# Patient Record
Sex: Male | Born: 1980 | Race: White | Hispanic: No | Marital: Married | Smoking: Never smoker
Health system: Southern US, Community
[De-identification: ages and names within clinical notes are randomized; demographics above are authoritative.]

## PROBLEM LIST (undated history)

## (undated) DIAGNOSIS — R030 Elevated blood-pressure reading, without diagnosis of hypertension: Principal | ICD-10-CM

## (undated) DIAGNOSIS — J45909 Unspecified asthma, uncomplicated: Secondary | ICD-10-CM

## (undated) DIAGNOSIS — J302 Other seasonal allergic rhinitis: Secondary | ICD-10-CM

## (undated) DIAGNOSIS — IMO0001 Reserved for inherently not codable concepts without codable children: Secondary | ICD-10-CM

## (undated) DIAGNOSIS — G4733 Obstructive sleep apnea (adult) (pediatric): Secondary | ICD-10-CM

## (undated) HISTORY — DX: Obstructive sleep apnea (adult) (pediatric): G47.33

## (undated) HISTORY — DX: Unspecified asthma, uncomplicated: J45.909

## (undated) HISTORY — DX: Elevated blood-pressure reading, without diagnosis of hypertension: R03.0

## (undated) HISTORY — DX: Other seasonal allergic rhinitis: J30.2

## (undated) HISTORY — DX: Reserved for inherently not codable concepts without codable children: IMO0001

## (undated) HISTORY — PX: NASAL SEPTUM SURGERY: SHX37

---
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
## 2010-02-10 ENCOUNTER — Ambulatory Visit: Payer: Self-pay | Admitting: Otolaryngology

## 2011-07-26 HISTORY — PX: NASAL SEPTUM SURGERY: SHX37

## 2015-11-10 ENCOUNTER — Ambulatory Visit (INDEPENDENT_AMBULATORY_CARE_PROVIDER_SITE_OTHER): Payer: BC Managed Care – PPO | Admitting: Primary Care

## 2015-11-10 ENCOUNTER — Encounter: Payer: Self-pay | Admitting: Primary Care

## 2015-11-10 VITALS — BP 160/94 | HR 95 | Temp 97.7°F | Ht 68.0 in | Wt 206.4 lb

## 2015-11-10 DIAGNOSIS — F419 Anxiety disorder, unspecified: Secondary | ICD-10-CM | POA: Insufficient documentation

## 2015-11-10 DIAGNOSIS — I10 Essential (primary) hypertension: Secondary | ICD-10-CM | POA: Insufficient documentation

## 2015-11-10 DIAGNOSIS — J4520 Mild intermittent asthma, uncomplicated: Secondary | ICD-10-CM

## 2015-11-10 DIAGNOSIS — K219 Gastro-esophageal reflux disease without esophagitis: Secondary | ICD-10-CM | POA: Diagnosis not present

## 2015-11-10 DIAGNOSIS — K625 Hemorrhage of anus and rectum: Secondary | ICD-10-CM | POA: Diagnosis not present

## 2015-11-10 DIAGNOSIS — IMO0001 Reserved for inherently not codable concepts without codable children: Secondary | ICD-10-CM

## 2015-11-10 DIAGNOSIS — J45909 Unspecified asthma, uncomplicated: Secondary | ICD-10-CM | POA: Insufficient documentation

## 2015-11-10 DIAGNOSIS — R030 Elevated blood-pressure reading, without diagnosis of hypertension: Secondary | ICD-10-CM

## 2015-11-10 LAB — IFOBT (OCCULT BLOOD): IFOBT: NEGATIVE

## 2015-11-10 NOTE — Assessment & Plan Note (Signed)
Noticed on toilet paper after bowel movement 2 times in 2 weeks. Exam today with small, non thrombosed, non-tender, hemorrhoid. Discussed importance of water hydration, high fiber foods, etc.  Hemoccult stool card negative today. Will continue to monitor.

## 2015-11-10 NOTE — Progress Notes (Signed)
Subjective:    Patient ID: Mathew Jackson, male    DOB: 12-29-1980, 35 y.o.   MRN: [id number]  HPI  [name] is a 35 year old male who presents today to establish care and discuss the problems mentioned below. His last physical was 8-10 years ago and he's not recently sought medical care from a PCP.   1) Asthma: Diagnosed as a child. He follows with ENT (Dr. Elenore RotaJuengel) once annually. History of septal surgery. No problems with asthma symptoms   2) Rectal Bleeding: First noticed in his stool and on the toilet paper after wiping 2 weeks ago. This has occurred twice in the past 2 weeks and has not noticed since Saturday this past weekend. Denies constipation, he will have bowel movements 1-2 times daily. Denies unexplained weight loss, fatigue, weakness.   3) Elevated Blood Pressure: Denies prior history of elevated readings. He has a strong family history of heart disease and HTN with his father, uncle and grandfather, all on fathers side. Denies chest pain, headaches, dizziness, shortness of breath. He's not had a recent check of his blood pressure prior to today. He has noticed change in his vision recently.   He endorses a poor diet which consists of: Breakfast: Skips Lunch: Burgers, subs, cafeteria food Dinner: Fast food, rarely home cooked meals. Snacks: None Desserts: Occasionally Beverages: Soda, little water  Exercise: He does not currently exercise.   4) GERD: Esophageal burning and belching that has been present intermittently for the past 6 months. He will occasionally take Rolaids or Tums with improvement. He is aware of some trigger foods.  5) Anxiety: Over the past 10 years has experienced occasoinal anxiety and stress when thinking about his general health. This began after his father was diagnosed with Renal Cell Carcinoma and after his brother endured a traumatic brain injury. He will experience palpitations occasionally. He's able to manage his anxiety for now but just  wanted to disclose this information.   Review of Systems  Eyes: Negative for visual disturbance.  Respiratory: Negative for shortness of breath.   Cardiovascular: Negative for chest pain.  Gastrointestinal: Positive for blood in stool. Negative for abdominal pain.       Occasional GERD  Allergic/Immunologic: Positive for environmental allergies.  Neurological: Negative for dizziness and headaches.  Psychiatric/Behavioral:       See HPI       Past Medical History  Diagnosis Date  . Asthma   . Seasonal allergies   . OSA (obstructive sleep apnea)   . Elevated blood pressure      Social History   Social History  . Marital Status: Married    Spouse Name: N/A  . Number of Children: N/A  . Years of Education: N/A   Occupational History  . Not on file.   Social History Main Topics  . Smoking status: Never Smoker   . Smokeless tobacco: Not on file  . Alcohol Use: No  . Drug Use: No  . Sexual Activity: Not on file   Other Topics Concern  . Not on file   Social History Narrative   Married.   1 child.   Works at [employer].    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   Enjoys playing golf, traveling.     Past Surgical History  Procedure Laterality Date  . Nasal septum surgery      Family History  Problem Relation Age of Onset  . Heart failure Father   .  Hypertension Father   . Kidney cancer Father   . Heart attack Father   . Heart disease Paternal Uncle   . Heart disease Paternal Grandfather     No Known Allergies  No current outpatient prescriptions on file prior to visit.   No current facility-administered medications on file prior to visit.    BP 160/94 mmHg  Pulse 95  Temp(Src) 97.7 F (36.5 C) (Oral)  Ht  (1.727 m)  Wt 206 lb 6.4 oz (93.622 kg)  BMI 31.39 kg/m2  SpO2 98%    Objective:   Physical Exam  Constitutional: He is oriented to person, place, and time. He appears well-nourished.  Neck: Neck supple.   Cardiovascular: Normal rate and regular rhythm.   Pulmonary/Chest: Effort normal and breath sounds normal. He has no wheezes. He has no rales.  Genitourinary: Rectal exam shows external hemorrhoid. Guaiac negative stool.  Small, non thrombosed, non tender, external hemorrhoid to anus.   Neurological: He is alert and oriented to person, place, and time.  Skin: Skin is warm and dry.  Psychiatric: He has a normal mood and affect.          Assessment & Plan:

## 2015-11-10 NOTE — Assessment & Plan Note (Signed)
Belching and esophageal reflux intermittently x 6 months. Diet is poor and likely causing symptoms.  Discuss triggers, okay to take Tums PRN.

## 2015-11-10 NOTE — Progress Notes (Signed)
Pre visit review using our clinic review tool, if applicable. No additional management support is needed unless otherwise documented below in the visit note. 

## 2015-11-10 NOTE — Assessment & Plan Note (Signed)
Elevated today in office at 160/94. Strong FH of heart disease. Patient has no prior history of HTN. Will have him monitor BP if possible and follow up in 1 month for re-evaluation. Information provided regarding DASH diet.

## 2015-11-10 NOTE — Assessment & Plan Note (Signed)
Diagnosed as child, improved since adulthood. Follows with ENT for allergies and sinus problems.

## 2015-11-10 NOTE — Assessment & Plan Note (Signed)
Intermittent over the past 10 years. Mostly anxious about general health. Will continue to monitor and discuss in greater detail during physical.

## 2015-11-10 NOTE — Patient Instructions (Signed)
It is important that you improve your diet. Please limit fast food, fried food, sugary drinks, etc. Increase your consumption of fresh fruits and vegetables.  You need to consume about 64 ounces of water daily.  Start exercising. You should be getting 1 hour of moderate intensity exercise 5 days weekly.  Please notify me if you notice bleeding in your stool again.  Please schedule a physical with me within the next 3 months. You may also schedule a lab only appointment 3-4 days prior. We will discuss your lab results in detail during your physical.  Follow up in 1 month for re-evaluation of your blood pressure.   It was a pleasure to meet you today! Please don't hesitate to call me with any questions. Welcome to [facility]!    DASH Eating Plan DASH stands for "Dietary Approaches to Stop Hypertension." The DASH eating plan is a healthy eating plan that has been shown to reduce high blood pressure (hypertension). Additional health benefits may include reducing the risk of type 2 diabetes mellitus, heart disease, and stroke. The DASH eating plan may also help with weight loss. WHAT DO I NEED TO KNOW ABOUT THE DASH EATING PLAN? For the DASH eating plan, you will follow these general guidelines:  Choose foods with a percent daily value for sodium of less than 5% (as listed on the food label).  Use salt-free seasonings or herbs instead of table salt or sea salt.  Check with your health care provider or pharmacist before using salt substitutes.  Eat lower-sodium products, often labeled as "lower sodium" or "no salt added."  Eat fresh foods.  Eat more vegetables, fruits, and low-fat dairy products.  Choose whole grains. Look for the word "whole" as the first word in the ingredient list.  Choose fish and skinless chicken or [place] more often than red meat. Limit fish, poultry, and meat to 6 oz (170 g) each day.  Limit sweets, desserts, sugars, and sugary drinks.  Choose heart-healthy  fats.  Limit cheese to 1 oz (28 g) per day.  Eat more home-cooked food and less restaurant, buffet, and fast food.  Limit fried foods.  Cook foods using methods other than frying.  Limit canned vegetables. If you do use them, rinse them well to decrease the sodium.  When eating at a restaurant, ask that your food be prepared with less salt, or no salt if possible. WHAT FOODS CAN I EAT? Seek help from a dietitian for individual calorie needs. Grains Whole grain or whole wheat bread. Brown rice. Whole grain or whole wheat pasta. Quinoa, bulgur, and whole grain cereals. Low-sodium cereals. Corn or whole wheat flour tortillas. Whole grain cornbread. Whole grain crackers. Low-sodium crackers. Vegetables Fresh or frozen vegetables (raw, steamed, roasted, or grilled). Low-sodium or reduced-sodium tomato and vegetable juices. Low-sodium or reduced-sodium tomato sauce and paste. Low-sodium or reduced-sodium canned vegetables.  Fruits All fresh, canned (in natural juice), or frozen fruits. Meat and Other Protein Products Ground beef (85% or leaner), grass-fed beef, or beef trimmed of fat. Skinless chicken or [place]. Ground chicken or [place]. Pork trimmed of fat. All fish and seafood. Eggs. Dried beans, peas, or lentils. Unsalted nuts and seeds. Unsalted canned beans. Dairy Low-fat dairy products, such as skim or 1% milk, 2% or reduced-fat cheeses, low-fat ricotta or cottage cheese, or plain low-fat yogurt. Low-sodium or reduced-sodium cheeses. Fats and Oils Tub margarines without trans fats. Light or reduced-fat mayonnaise and salad dressings (reduced sodium). Avocado. Safflower, olive, or canola oils. Natural peanut  or almond butter. Other Unsalted popcorn and pretzels. The items listed above may not be a complete list of recommended foods or beverages. Contact your dietitian for more options. WHAT FOODS ARE NOT RECOMMENDED? Grains White bread. White pasta. White rice. Refined cornbread.  Bagels and croissants. Crackers that contain trans fat. Vegetables Creamed or fried vegetables. Vegetables in a cheese sauce. Regular canned vegetables. Regular canned tomato sauce and paste. Regular tomato and vegetable juices. Fruits Dried fruits. Canned fruit in light or heavy syrup. Fruit juice. Meat and Other Protein Products Fatty cuts of meat. Ribs, chicken wings, bacon, sausage, bologna, salami, chitterlings, fatback, hot dogs, bratwurst, and packaged luncheon meats. Salted nuts and seeds. Canned beans with salt. Dairy Whole or 2% milk, cream, half-and-half, and cream cheese. Whole-fat or sweetened yogurt. Full-fat cheeses or blue cheese. Nondairy creamers and whipped toppings. Processed cheese, cheese spreads, or cheese curds. Condiments Onion and garlic salt, seasoned salt, table salt, and sea salt. Canned and packaged gravies. Worcestershire sauce. Tartar sauce. Barbecue sauce. Teriyaki sauce. Soy sauce, including reduced sodium. Steak sauce. Fish sauce. Oyster sauce. Cocktail sauce. Horseradish. Ketchup and mustard. Meat flavorings and tenderizers. Bouillon cubes. Hot sauce. Tabasco sauce. Marinades. Taco seasonings. Relishes. Fats and Oils Butter, stick margarine, lard, shortening, ghee, and bacon fat. Coconut, palm kernel, or palm oils. Regular salad dressings. Other Pickles and olives. Salted popcorn and pretzels. The items listed above may not be a complete list of foods and beverages to avoid. Contact your dietitian for more information. WHERE CAN I FIND MORE INFORMATION? National Heart, Lung, and Blood Institute: CablePromo.itwww.nhlbi.nih.gov/health/health-topics/topics/dash/   This information is not intended to replace advice given to you by your health care provider. Make sure you discuss any questions you have with your health care provider.   Document Released: 06/30/2011 Document Revised: 08/01/2014 Document Reviewed: 05/15/2013 Elsevier Interactive Patient Education [employer]2016 Elsevier  Inc.

## 2015-11-11 NOTE — Addendum Note (Signed)
Addended by: Tawnya CrookSAMBATH, Edell Mesenbrink on: 11/11/2015 03:49 PM   Modules accepted: Orders

## 2015-12-14 ENCOUNTER — Ambulatory Visit: Payer: BC Managed Care – PPO | Admitting: Primary Care

## 2015-12-17 ENCOUNTER — Encounter: Payer: Self-pay | Admitting: Primary Care

## 2015-12-17 ENCOUNTER — Ambulatory Visit (INDEPENDENT_AMBULATORY_CARE_PROVIDER_SITE_OTHER): Payer: BC Managed Care – PPO | Admitting: Primary Care

## 2015-12-17 VITALS — BP 144/92 | HR 79 | Temp 97.8°F | Ht 68.0 in | Wt 208.1 lb

## 2015-12-17 DIAGNOSIS — Z23 Encounter for immunization: Secondary | ICD-10-CM

## 2015-12-17 DIAGNOSIS — R030 Elevated blood-pressure reading, without diagnosis of hypertension: Secondary | ICD-10-CM | POA: Diagnosis not present

## 2015-12-17 DIAGNOSIS — IMO0001 Reserved for inherently not codable concepts without codable children: Secondary | ICD-10-CM

## 2015-12-17 DIAGNOSIS — G4733 Obstructive sleep apnea (adult) (pediatric): Secondary | ICD-10-CM | POA: Diagnosis not present

## 2015-12-17 DIAGNOSIS — Z0000 Encounter for general adult medical examination without abnormal findings: Secondary | ICD-10-CM | POA: Insufficient documentation

## 2015-12-17 NOTE — Progress Notes (Signed)
Subjective:    Patient ID: Mathew Jackson, male    DOB: [birth year], 35 y.o.   MRN: [id number]  HPI  Mathew Jackson is a 35 year old male who presents today for complete physical.  Immunizations: -Tetanus: Unsure, believes it was in 2006.  -Influenza: Did not complete last season   Diet: Endorses a fair diet. Breakfast: Wheat toast Lunch: Subway sandwiches with chips, fast food Dinner: Reduced fast food, meat (chicken), vegetables, pastas, potatoes  Snacks: None Desserts: Occasionally, ice cream Beverages: Water (30-50 oz) daily, Pepsi (24-48 oz) daily  Exercise: He does not exercise. Eye exam: Completed numerous years ago. Dental exam: Completed numerous years ago.   1) Essential Hypertension: Elevated during his initial visit in mid April 2017 at 160/94. Blood pressure is elevated in the clinic today at 144/92. He is checking his blood pressure at home and is getting readings of 140's/80-90's. He's working to improve his water consumption and eat more vegetables. Denies chest pain, dizziness, shortness of breath. He does have sleep apnea and sleeps with a cpap machine. He's not had adjustment or equipment since 2011. He has noticed decrease in sleep and doesn't feel well rested. He's noticed his mask has been leaking.  Review of Systems  Constitutional: Negative for unexpected weight change.  HENT: Negative for rhinorrhea.   Respiratory: Negative for cough and shortness of breath.   Cardiovascular: Negative for chest pain.  Gastrointestinal: Negative for diarrhea and constipation.  Genitourinary: Negative for difficulty urinating.  Musculoskeletal: Negative for myalgias and arthralgias.  Skin: Negative for rash.  Allergic/Immunologic: Positive for environmental allergies.  Neurological: Negative for dizziness, numbness and headaches.  Psychiatric/Behavioral:       Denies concerns for anxiety and depression       Past Medical History  Diagnosis Date  . Asthma   .  Seasonal allergies   . OSA (obstructive sleep apnea)   . Elevated blood pressure      Social History   Social History  . Marital Status: Married    Spouse Name: N/A  . Number of Children: N/A  . Years of Education: N/A   Occupational History  . Not on file.   Social History Main Topics  . Smoking status: Never Smoker   . Smokeless tobacco: Not on file  . Alcohol Use: No  . Drug Use: No  . Sexual Activity: Not on file   Other Topics Concern  . Not on file   Social History Narrative   Married.   1 child.   Works at [employer].    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   Enjoys playing golf, traveling.     Past Surgical History  Procedure Laterality Date  . Nasal septum surgery      Family History  Problem Relation Age of Onset  . Heart failure Father   . Hypertension Father   . Kidney cancer Father   . Heart attack Father   . Heart disease Paternal Uncle   . Heart disease Paternal Grandfather     No Known Allergies  No current outpatient prescriptions on file prior to visit.   No current facility-administered medications on file prior to visit.    BP 144/92 mmHg  Pulse 79  Temp(Src) 97.8 F (36.6 C) (Oral)  Ht  (1.727 m)  Wt 208 lb 1.9 oz (94.403 kg)  BMI 31.65 kg/m2  SpO2 98%    Objective:   Physical Exam  Constitutional: He  is oriented to person, place, and time. He appears well-nourished.  HENT:  Right Ear: Tympanic membrane and ear canal normal.  Left Ear: Tympanic membrane and ear canal normal.  Nose: Nose normal. Right sinus exhibits no maxillary sinus tenderness and no frontal sinus tenderness. Left sinus exhibits no maxillary sinus tenderness and no frontal sinus tenderness.  Mouth/Throat: Oropharynx is clear and moist.  Eyes: Conjunctivae and EOM are normal. Pupils are equal, round, and reactive to light.  Neck: Neck supple. Carotid bruit is not present. No thyromegaly present.  Cardiovascular:  Normal rate, regular rhythm and normal heart sounds.   Pulmonary/Chest: Effort normal and breath sounds normal. He has no wheezes. He has no rales.  Abdominal: Soft. Bowel sounds are normal. There is no tenderness.  Musculoskeletal: Normal range of motion.  Neurological: He is alert and oriented to person, place, and time. He has normal reflexes. No cranial nerve deficit.  Skin: Skin is warm and dry.  Psychiatric: He has a normal mood and affect.          Assessment & Plan:

## 2015-12-17 NOTE — Assessment & Plan Note (Signed)
Td due, provided today. Diet has slightly improved, discussed the importance of a healthy diet and regular exercise in order for weight loss and to reduce risk of other medical diseases. Exam unremarkable. Labs pending.  Follow up in 3 months for BP check, 1 year for physical.

## 2015-12-17 NOTE — Addendum Note (Signed)
Addended by: Tawnya CrookSAMBATH, Loany Neuroth on: 12/17/2015 04:31 PM   Modules accepted: Orders, SmartSet

## 2015-12-17 NOTE — Assessment & Plan Note (Signed)
Improved from initial visit, but still above goal. Home readings are above goal as well. He did mention for the first time today that he has OSA and that his mask hasn't been fitting properly for months. Suspect this is likely contributing to his elevation in BP. Will send him to pulmonology for adjustment and then follow up for BP evaluation. Information for reduced sodium diet provided.

## 2015-12-17 NOTE — Patient Instructions (Signed)
Complete lab work prior to leaving today. I will notify you of your results once received.   You will be contacted regarding your referral to Pulmonology for adjustment in your CPAP machine settings and equipment.  Please let us know if you have not heard back within one week.   Continue to monitor your blood pressure once to twice weekly.   Follow up in 3 months for re-evaluation of your blood pressure.  It was a pleasure to see you today!  Low-Sodium Eating Plan Sodium raises blood pressure and causes water to be held in the body. Getting less sodium from food will help lower your blood pressure, reduce any swelling, and protect your heart, liver, and kidneys. We get sodium by adding salt (sodium chloride) to food. Most of our sodium comes from canned, boxed, and frozen foods. Restaurant foods, fast foods, and pizza are also very high in sodium. Even if you take medicine to lower your blood pressure or to reduce fluid in your body, getting less sodium from your food is important. WHAT IS MY PLAN? Most people should limit their sodium intake to 2,300 mg a day. Your health care provider recommends that you limit your sodium intake to __________ a day.  WHAT DO I NEED TO KNOW ABOUT THIS EATING PLAN? For the low-sodium eating plan, you will follow these general guidelines:  Choose foods with a % Daily Value for sodium of less than 5% (as listed on the food label).   Use salt-free seasonings or herbs instead of table salt or sea salt.   Check with your health care provider or pharmacist before using salt substitutes.   Eat fresh foods.  Eat more vegetables and fruits.  Limit canned vegetables. If you do use them, rinse them well to decrease the sodium.   Limit cheese to 1 oz (28 g) per day.   Eat lower-sodium products, often labeled as "lower sodium" or "no salt added."  Avoid foods that contain monosodium glutamate (MSG). MSG is sometimes added to [place] food and some canned  foods.  Check food labels (Nutrition Facts labels) on foods to learn how much sodium is in one serving.  Eat more home-cooked food and less restaurant, buffet, and fast food.  When eating at a restaurant, ask that your food be prepared with less salt, or no salt if possible.  HOW DO I READ FOOD LABELS FOR SODIUM INFORMATION? The Nutrition Facts label lists the amount of sodium in one serving of the food. If you eat more than one serving, you must multiply the listed amount of sodium by the number of servings. Food labels may also identify foods as:  Sodium free--Less than 5 mg in a serving.  Very low sodium--35 mg or less in a serving.  Low sodium--140 mg or less in a serving.  Light in sodium--50% less sodium in a serving. For example, if a food that usually has 300 mg of sodium is changed to become light in sodium, it will have 150 mg of sodium.  Reduced sodium--25% less sodium in a serving. For example, if a food that usually has 400 mg of sodium is changed to reduced sodium, it will have 300 mg of sodium. WHAT FOODS CAN I EAT? Grains Low-sodium cereals, including oats, puffed wheat and rice, and shredded wheat cereals. Low-sodium crackers. Unsalted rice and pasta. Lower-sodium bread.  Vegetables Frozen or fresh vegetables. Low-sodium or reduced-sodium canned vegetables. Low-sodium or reduced-sodium tomato sauce and paste. Low-sodium or reduced-sodium tomato and vegetable  juices.  Fruits Fresh, frozen, and canned fruit. Fruit juice.  Meat and Other Protein Products Low-sodium canned tuna and salmon. Fresh or frozen meat, poultry, seafood, and fish. Lamb. Unsalted nuts. Dried beans, peas, and lentils without added salt. Unsalted canned beans. Homemade soups without salt. Eggs.  Dairy Milk. Soy milk. Ricotta cheese. Low-sodium or reduced-sodium cheeses. Yogurt.  Condiments Fresh and dried herbs and spices. Salt-free seasonings. Onion and garlic powders. Low-sodium varieties  of mustard and ketchup. Fresh or refrigerated horseradish. Lemon juice.  Fats and Oils Reduced-sodium salad dressings. Unsalted butter.  Other Unsalted popcorn and pretzels.  The items listed above may not be a complete list of recommended foods or beverages. Contact your dietitian for more options. WHAT FOODS ARE NOT RECOMMENDED? Grains Instant hot cereals. Bread stuffing, pancake, and biscuit mixes. Croutons. Seasoned rice or pasta mixes. Noodle soup cups. Boxed or frozen macaroni and cheese. Self-rising flour. Regular salted crackers. Vegetables Regular canned vegetables. Regular canned tomato sauce and paste. Regular tomato and vegetable juices. Frozen vegetables in sauces. Salted [place] fries. Olives. Rosita Fire. Relishes. Sauerkraut. Salsa. Meat and Other Protein Products Salted, canned, smoked, spiced, or pickled meats, seafood, or fish. Bacon, ham, sausage, hot dogs, corned beef, chipped beef, and packaged luncheon meats. Salt pork. Jerky. Pickled herring. Anchovies, regular canned tuna, and sardines. Salted nuts. Dairy Processed cheese and cheese spreads. Cheese curds. Blue cheese and cottage cheese. Buttermilk.  Condiments Onion and garlic salt, seasoned salt, table salt, and sea salt. Canned and packaged gravies. Worcestershire sauce. Tartar sauce. Barbecue sauce. Teriyaki sauce. Soy sauce, including reduced sodium. Steak sauce. Fish sauce. Oyster sauce. Cocktail sauce. Horseradish that you find on the shelf. Regular ketchup and mustard. Meat flavorings and tenderizers. Bouillon cubes. Hot sauce. Tabasco sauce. Marinades. Taco seasonings. Relishes. Fats and Oils Regular salad dressings. Salted butter. Margarine. Ghee. Bacon fat.  Other Potato and tortilla chips. Corn chips and puffs. Salted popcorn and pretzels. Canned or dried soups. Pizza. Frozen entrees and pot pies.  The items listed above may not be a complete list of foods and beverages to avoid. Contact your dietitian  for more information.   This information is not intended to replace advice given to you by your health care provider. Make sure you discuss any questions you have with your health care provider.   Document Released: 12/31/2001 Document Revised: 08/01/2014 Document Reviewed: 05/15/2013 Elsevier Interactive Patient Education [employer]

## 2015-12-17 NOTE — Progress Notes (Signed)
Pre visit review using our clinic review tool, if applicable. No additional management support is needed unless otherwise documented below in the visit note. 

## 2015-12-17 NOTE — Assessment & Plan Note (Addendum)
Sleeps with Cpap and has done so since 2011. No recent adjustment in settings since 2011. Mask has been leaking for months. Referral placed to pulmonology for further evaluation and titration of settings. This is likely contributing to his elevation in BP.

## 2015-12-18 ENCOUNTER — Other Ambulatory Visit: Payer: Self-pay | Admitting: Primary Care

## 2015-12-18 DIAGNOSIS — E785 Hyperlipidemia, unspecified: Secondary | ICD-10-CM

## 2015-12-18 LAB — LIPID PANEL
Cholesterol: 221 mg/dL — ABNORMAL HIGH (ref 0–200)
HDL: 34.7 mg/dL — ABNORMAL LOW (ref >39.00)
NonHDL: 186.41
Total CHOL/HDL Ratio: 6
Triglycerides: 270 mg/dL — ABNORMAL HIGH (ref 0.0–149.0)
VLDL: 54 mg/dL — ABNORMAL HIGH (ref 0.0–40.0)

## 2015-12-18 LAB — COMPREHENSIVE METABOLIC PANEL
ALT: 65 U/L — ABNORMAL HIGH (ref 0–53)
AST: 36 U/L (ref 0–37)
Albumin: 5.1 g/dL (ref 3.5–5.2)
Alkaline Phosphatase: 60 U/L (ref 39–117)
BUN: 12 mg/dL (ref 6–23)
CO2: 29 mEq/L (ref 19–32)
Calcium: 9.9 mg/dL (ref 8.4–10.5)
Chloride: 102 mEq/L (ref 96–112)
Creatinine, Ser: 0.91 mg/dL (ref 0.40–1.50)
GFR: 101.12 mL/min (ref >60.00)
Glucose, Bld: 87 mg/dL (ref 70–99)
Potassium: 4 mEq/L (ref 3.5–5.1)
Sodium: 138 mEq/L (ref 135–145)
Total Bilirubin: 0.7 mg/dL (ref 0.2–1.2)
Total Protein: 8 g/dL (ref 6.0–8.3)

## 2015-12-18 LAB — TSH: TSH: 0.85 u[IU]/mL (ref 0.35–4.50)

## 2015-12-18 LAB — LDL CHOLESTEROL, DIRECT: Direct LDL: 140 mg/dL

## 2015-12-22 ENCOUNTER — Encounter: Payer: Self-pay | Admitting: *Deleted

## 2015-12-29 ENCOUNTER — Encounter (INDEPENDENT_AMBULATORY_CARE_PROVIDER_SITE_OTHER): Payer: Self-pay

## 2015-12-29 ENCOUNTER — Ambulatory Visit (INDEPENDENT_AMBULATORY_CARE_PROVIDER_SITE_OTHER): Payer: BC Managed Care – PPO | Admitting: Pulmonary Disease

## 2015-12-29 ENCOUNTER — Encounter: Payer: Self-pay | Admitting: Pulmonary Disease

## 2015-12-29 VITALS — BP 144/80 | HR 82 | Ht 68.0 in | Wt 209.0 lb

## 2015-12-29 DIAGNOSIS — G4733 Obstructive sleep apnea (adult) (pediatric): Secondary | ICD-10-CM | POA: Diagnosis not present

## 2015-12-29 DIAGNOSIS — E669 Obesity, unspecified: Secondary | ICD-10-CM

## 2015-12-29 NOTE — Progress Notes (Signed)
PULMONARY CONSULT NOTE  Requesting MD/Service: Vernona Rieger Date of initial consultation: 12/29/15 Reason for consultation: OSA  PT PROFILE: 35 y.o. M with OSA first diagnosed 2011  HPI:  43 M with hx of OSA who has been treated with CPAP since 2011. His current setting on the machine is 6 cm H2O. He reported to his primary care physician that his wife reports snoring despite the CPAP and he notes that his BP has been elevated. He also notes increasing daytime sleepiness. He e has gaind approx 25# in past couple of years after making a concerted effort at weight loss  Past Medical History  Diagnosis Date  . Asthma   . Seasonal allergies   . OSA (obstructive sleep apnea)   . Elevated blood pressure     Past Surgical History  Procedure Laterality Date  . Nasal septum surgery      MEDICATIONS: I have reviewed all medications and confirmed regimen as documented  Social History   Social History  . Marital Status: Married    Spouse Name: N/A  . Number of Children: N/A  . Years of Education: N/A   Occupational History  . Not on file.   Social History Main Topics  . Smoking status: Never Smoker   . Smokeless tobacco: Current User    Types: Snuff  . Alcohol Use: No  . Drug Use: No  . Sexual Activity: Not on file   Other Topics Concern  . Not on file   Social History Narrative   Married.   1 child.   Works at [employer].    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   Enjoys playing golf, traveling.     Family History  Problem Relation Age of Onset  . Heart failure Father   . Hypertension Father   . Kidney cancer Father   . Heart attack Father   . Lung cancer Father   . Heart disease Paternal Uncle   . Heart disease Paternal Grandfather     ROS: No fever, myalgias/arthralgias, unexplained weight loss or weight gain No new focal weakness or sensory deficits No otalgia, hearing loss, visual changes, nasal and sinus symptoms, mouth  and throat problems No neck pain or adenopathy No abdominal pain, N/V/D, diarrhea, change in bowel pattern No dysuria, change in urinary pattern No LE edema or calf tenderness   Filed Vitals:   12/29/15 1009  BP: 144/80  Pulse: 82  Height:  (1.727 m)  Weight: 209 lb (94.802 kg)  SpO2: 98%     EXAM:  Gen: WDWN, No overt respiratory distress HEENT: NCAT, TMs and canals normal, sclera white, nares and nasal mucosa normal, oropharynx normal Neck: Supple without LAN, thyromegaly, JVD Lungs: breath sounds full, percussion note normal throughout, No adventitious sounds Cardiovascular: Normal rate, reg rhythm, no murmurs noted Abdomen: Soft, nontender, normal BS Ext: without clubbing, cyanosis, edema Neuro: CNs grossly intact, motor and sensory intact, DTRs symmetric Skin: Limited exam, no lesions noted  DATA:   BMP Latest Ref Rng 12/17/2015  Glucose 70 - 99 mg/dL 87  BUN 6 - 23 mg/dL 12  Creatinine 8.29 - 5.62 mg/dL 1.30  Sodium 865 - 784 mEq/L 138  Potassium 3.5 - 5.1 mEq/L 4.0  Chloride 96 - 112 mEq/L 102  CO2 19 - 32 mEq/L 29  Calcium 8.4 - 10.5 mg/dL 9.9    No flowsheet data found.  CXR:  No film avaiable  IMPRESSION:  ICD-9-CM ICD-10-CM   1. OSA (obstructive sleep apnea) 327.23 G47.33 Split night study  2. Mild obesity 278.00 E66.9     PLAN:  Split night study ordered Discussed weight loss strategies - he knows what works for him as he has been successful in the past ROV 6 weeks   Billy Fischeravid Hollan Philipp, MD PCCM service Mobile [phone] Pager [phone] 12/29/2015

## 2016-02-09 ENCOUNTER — Ambulatory Visit: Payer: BC Managed Care – PPO | Attending: Pulmonary Disease

## 2016-02-09 DIAGNOSIS — G4733 Obstructive sleep apnea (adult) (pediatric): Secondary | ICD-10-CM | POA: Insufficient documentation

## 2016-02-10 ENCOUNTER — Ambulatory Visit: Payer: BC Managed Care – PPO | Admitting: Pulmonary Disease

## 2016-02-16 DIAGNOSIS — G4733 Obstructive sleep apnea (adult) (pediatric): Secondary | ICD-10-CM | POA: Diagnosis not present

## 2016-02-22 ENCOUNTER — Telehealth: Payer: Self-pay | Admitting: *Deleted

## 2016-02-22 NOTE — Telephone Encounter (Signed)
LMOM for pt to call back for results

## 2016-02-24 NOTE — Telephone Encounter (Signed)
LMOVM for pt to return call for results. 

## 2016-02-25 NOTE — Telephone Encounter (Signed)
LM for pt to return call for results

## 2016-02-26 ENCOUNTER — Encounter: Payer: Self-pay | Admitting: *Deleted

## 2016-02-26 NOTE — Telephone Encounter (Signed)
LMOM for pt to return call. Will send letter and close encounter due to multiple attempts to contact pt.

## 2016-03-02 ENCOUNTER — Ambulatory Visit: Payer: BC Managed Care – PPO | Admitting: Pulmonary Disease

## 2016-03-02 ENCOUNTER — Telehealth: Payer: Self-pay | Admitting: *Deleted

## 2016-03-02 DIAGNOSIS — G4733 Obstructive sleep apnea (adult) (pediatric): Secondary | ICD-10-CM

## 2016-03-02 NOTE — Telephone Encounter (Signed)
Gave pt results of sleep study and cancelled today's appt per DS since pt has just called back after multiple attempts of trying to contact pt. New appt scheduled. Order placed for CPAP. Nothing further needed.

## 2016-03-15 ENCOUNTER — Ambulatory Visit: Payer: BC Managed Care – PPO | Admitting: Primary Care

## 2016-03-15 ENCOUNTER — Telehealth: Payer: Self-pay | Admitting: Primary Care

## 2016-03-15 DIAGNOSIS — Z0289 Encounter for other administrative examinations: Secondary | ICD-10-CM

## 2016-03-15 NOTE — Telephone Encounter (Signed)
Patient did not come in for their appointment today for re-eval of bp  Please let me know if patient needs to be contacted immediately for follow up or no follow up needed.

## 2016-03-15 NOTE — Telephone Encounter (Signed)
Left message for pt to return call.

## 2016-03-15 NOTE — Telephone Encounter (Signed)
Please reschedule patient at his convenience.

## 2016-03-18 NOTE — Telephone Encounter (Signed)
Left message for patient to return call - need to rs appt

## 2016-03-23 ENCOUNTER — Encounter: Payer: Self-pay | Admitting: Primary Care

## 2016-03-23 NOTE — Telephone Encounter (Signed)
Letter sent to pt to rs appt

## 2016-04-10 ENCOUNTER — Encounter: Payer: Self-pay | Admitting: Pulmonary Disease

## 2016-04-14 ENCOUNTER — Encounter: Payer: Self-pay | Admitting: *Deleted

## 2016-04-14 ENCOUNTER — Ambulatory Visit: Payer: BC Managed Care – PPO | Admitting: Pulmonary Disease

## 2016-05-09 ENCOUNTER — Encounter: Payer: Self-pay | Admitting: Pulmonary Disease

## 2016-08-31 ENCOUNTER — Ambulatory Visit: Payer: BC Managed Care – PPO | Admitting: Primary Care

## 2016-09-01 ENCOUNTER — Ambulatory Visit: Payer: Self-pay | Admitting: Primary Care

## 2018-03-20 ENCOUNTER — Other Ambulatory Visit: Payer: Self-pay

## 2018-03-20 ENCOUNTER — Emergency Department (HOSPITAL_COMMUNITY): Payer: BC Managed Care – PPO

## 2018-03-20 ENCOUNTER — Encounter (HOSPITAL_COMMUNITY): Payer: Self-pay | Admitting: Emergency Medicine

## 2018-03-20 ENCOUNTER — Emergency Department (HOSPITAL_COMMUNITY)
Admission: EM | Admit: 2018-03-20 | Discharge: 2018-03-21 | Disposition: A | Discharge status: Home / Self Care | Payer: BC Managed Care – PPO | Attending: Emergency Medicine | Admitting: Emergency Medicine

## 2018-03-20 DIAGNOSIS — J45909 Unspecified asthma, uncomplicated: Secondary | ICD-10-CM | POA: Diagnosis not present

## 2018-03-20 DIAGNOSIS — F17220 Nicotine dependence, chewing tobacco, uncomplicated: Secondary | ICD-10-CM | POA: Insufficient documentation

## 2018-03-20 DIAGNOSIS — R109 Unspecified abdominal pain: Secondary | ICD-10-CM

## 2018-03-20 DIAGNOSIS — R1031 Right lower quadrant pain: Secondary | ICD-10-CM | POA: Insufficient documentation

## 2018-03-20 LAB — COMPREHENSIVE METABOLIC PANEL
ALBUMIN: 4.1 g/dL (ref 3.5–5.0)
ALT: 82 U/L — ABNORMAL HIGH (ref 0–44)
AST: 46 U/L — AB (ref 15–41)
Alkaline Phosphatase: 57 U/L (ref 38–126)
Anion gap: 10 (ref 5–15)
BILIRUBIN TOTAL: 0.7 mg/dL (ref 0.3–1.2)
BUN: 13 mg/dL (ref 6–20)
CHLORIDE: 102 mmol/L (ref 98–111)
CO2: 25 mmol/L (ref 22–32)
Calcium: 9.1 mg/dL (ref 8.9–10.3)
Creatinine, Ser: 0.97 mg/dL (ref 0.61–1.24)
GFR calc Af Amer: >60 mL/min (ref >60)
GFR calc non Af Amer: >60 mL/min (ref >60)
GLUCOSE: 111 mg/dL — AB (ref 70–99)
POTASSIUM: 4.3 mmol/L (ref 3.5–5.1)
Sodium: 137 mmol/L (ref 135–145)
Total Protein: 7.7 g/dL (ref 6.5–8.1)

## 2018-03-20 LAB — CBC WITH DIFFERENTIAL/PLATELET
Abs Immature Granulocytes: 0 10*3/uL (ref 0.0–0.1)
Basophils Absolute: 0 10*3/uL (ref 0.0–0.1)
Basophils Relative: 0 %
Eosinophils Absolute: 0.2 10*3/uL (ref 0.0–0.7)
Eosinophils Relative: 2 %
HEMATOCRIT: 40.5 % (ref 39.0–52.0)
HEMOGLOBIN: 13.8 g/dL (ref 13.0–17.0)
Immature Granulocytes: 0 %
LYMPHS ABS: 1.4 10*3/uL (ref 0.7–4.0)
LYMPHS PCT: 20 %
MCH: 29.9 pg (ref 26.0–34.0)
MCHC: 34.1 g/dL (ref 30.0–36.0)
MCV: 87.7 fL (ref 78.0–100.0)
MONO ABS: 0.5 10*3/uL (ref 0.1–1.0)
Monocytes Relative: 7 %
Neutro Abs: 4.9 10*3/uL (ref 1.7–7.7)
Neutrophils Relative %: 71 %
Platelets: 248 10*3/uL (ref 150–400)
RBC: 4.62 MIL/uL (ref 4.22–5.81)
RDW: 12.1 % (ref 11.5–15.5)
WBC: 7 10*3/uL (ref 4.0–10.5)

## 2018-03-20 LAB — LIPASE, BLOOD: Lipase: 37 U/L (ref 11–51)

## 2018-03-20 LAB — I-STAT CG4 LACTIC ACID, ED: LACTIC ACID, VENOUS: 1.22 mmol/L (ref 0.5–1.9)

## 2018-03-20 MED ORDER — ACETAMINOPHEN 325 MG PO TABS
650.0000 mg | ORAL_TABLET | Freq: Once | ORAL | Status: AC | PRN
  Administered 2018-03-20: 650 mg via ORAL
  Filled 2018-03-20: qty 2

## 2018-03-20 NOTE — ED Triage Notes (Signed)
Pt reports sob d/t lower RLQ abd pain with fever that started this past weekend. Pt states increase in pain upon inspiration. Pt reports some diarrhea last week. Pt denies N/V, cough.

## 2018-03-21 ENCOUNTER — Emergency Department (HOSPITAL_COMMUNITY): Payer: BC Managed Care – PPO

## 2018-03-21 LAB — URINALYSIS, ROUTINE W REFLEX MICROSCOPIC
Bilirubin Urine: NEGATIVE
GLUCOSE, UA: NEGATIVE mg/dL
Hgb urine dipstick: NEGATIVE
KETONES UR: NEGATIVE mg/dL
LEUKOCYTES UA: NEGATIVE
NITRITE: NEGATIVE
PH: 5 (ref 5.0–8.0)
PROTEIN: NEGATIVE mg/dL
Specific Gravity, Urine: >1.046 — ABNORMAL HIGH (ref 1.005–1.030)

## 2018-03-21 LAB — I-STAT CG4 LACTIC ACID, ED: Lactic Acid, Venous: 0.61 mmol/L (ref 0.5–1.9)

## 2018-03-21 MED ORDER — IOPAMIDOL (ISOVUE-300) INJECTION 61%
INTRAVENOUS | Status: AC
  Filled 2018-03-21: qty 100

## 2018-03-21 MED ORDER — SODIUM CHLORIDE 0.9 % IV BOLUS
1000.0000 mL | Freq: Once | INTRAVENOUS | Status: AC
  Administered 2018-03-21: 1000 mL via INTRAVENOUS

## 2018-03-21 MED ORDER — IOPAMIDOL (ISOVUE-300) INJECTION 61%
100.0000 mL | Freq: Once | INTRAVENOUS | Status: AC | PRN
  Administered 2018-03-21: 100 mL via INTRAVENOUS

## 2018-03-21 NOTE — Discharge Instructions (Addendum)
Ibuprofen 600 mg every 6 hours as needed for pain.  Return to the emergency department for worsening pain, high fevers, bloody stools, or other new and concerning symptoms.

## 2018-03-21 NOTE — ED Notes (Signed)
ED Provider at bedside. 

## 2018-03-21 NOTE — ED Notes (Signed)
Patient transported to CT 

## 2018-03-21 NOTE — ED Provider Notes (Signed)
[facility]   CSN: [id number] Arrival date & time: 03/20/18  2103     History   Chief Complaint Chief Complaint  Patient presents with  . Shortness of Breath  . Fever  . Abdominal Pain    HPI Mathew Jackson is a 37 y.o. male.  Patient is a 37 year old male with significant past medical history.  He presents today for evaluation of abdominal pain.  He reports having what he thought was a stomach virus last week.  This caused him to have diarrhea and abdominal cramping.  This seemed to resolve, then his appetite returned.  Over the weekend, he developed increasing right lower quadrant abdominal pain with associated fever.  He denies any diarrhea or vomiting.  He denies any bloody stools.  He denies any prior abdominal surgery.  He states that the pain in his abdomen is worse when he breathes or moves.  The history is provided by the patient.  Abdominal Pain   This is a new problem. Episode onset: 3 days ago. The problem occurs constantly. The problem has been gradually worsening. The pain is associated with an unknown factor. The pain is located in the RLQ. The quality of the pain is cramping. The pain is moderate. Pertinent negatives include fever, melena and constipation. Nothing aggravates the symptoms. Nothing relieves the symptoms.    Past Medical History:  Diagnosis Date  . Asthma   . Elevated blood pressure   . OSA (obstructive sleep apnea)   . Seasonal allergies     Patient Active Problem List   Diagnosis Date Noted  . OSA (obstructive sleep apnea) 12/17/2015  . Preventative health care 12/17/2015  . Elevated blood pressure 11/10/2015  . Rectal bleeding 11/10/2015  . Asthma 11/10/2015  . GERD (gastroesophageal reflux disease) 11/10/2015  . Anxiety 11/10/2015    Past Surgical History:  Procedure Laterality Date  . NASAL SEPTUM SURGERY          Home Medications    Prior to Admission medications     Medication Sig Start Date End Date Taking? Authorizing Provider  acetaminophen (TYLENOL) 500 MG tablet Take 500 mg by mouth as needed.    [provider]    Family History Family History  Problem Relation Age of Onset  . Heart failure Father   . Hypertension Father   . Kidney cancer Father   . Heart attack Father   . Lung cancer Father   . Heart disease Paternal Uncle   . Heart disease Paternal Grandfather     Social History Social History   Tobacco Use  . Smoking status: Never Smoker  . Smokeless tobacco: Current User    Types: Snuff  Substance Use Topics  . Alcohol use: No    Alcohol/week: 0.0 standard drinks  . Drug use: No     Allergies   Patient has no known allergies.   Review of Systems Review of Systems  Constitutional: Negative for fever.  Gastrointestinal: Positive for abdominal pain. Negative for constipation and melena.  All other systems reviewed and are negative.    Physical Exam Updated Vital Signs BP 117/80 (BP Location: Right Arm)   Pulse 77   Temp (!) 100.9 F (38.3 C) (Oral)   Resp 14   Ht 5\' 8"  (1.727 m)   Wt 96.2 kg   SpO2 99%   BMI 32.23 kg/m   Physical Exam  Constitutional: He is oriented to person, place, and time. He appears well-developed  and well-nourished. No distress.  HENT:  Head: Normocephalic and atraumatic.  Mouth/Throat: Oropharynx is clear and moist.  Neck: Normal range of motion. Neck supple.  Cardiovascular: Normal rate and regular rhythm. Exam reveals no friction rub.  No murmur heard. Pulmonary/Chest: Effort normal and breath sounds normal. No respiratory distress. He has no wheezes. He has no rales.  Abdominal: Soft. Bowel sounds are normal. He exhibits no distension. There is tenderness. There is no rebound and no guarding.  There is ttp in the right lower quadrant.  Musculoskeletal: Normal range of motion. He exhibits no edema.  Neurological: He is alert and oriented to person, place, and time.  Coordination normal.  Skin: Skin is warm and dry. He is not diaphoretic.  Nursing note and vitals reviewed.    ED Treatments / Results  Labs (all labs ordered are listed, but only abnormal results are displayed) Labs Reviewed  COMPREHENSIVE METABOLIC PANEL - Abnormal; Notable for the following components:      Result Value   Glucose, Bld 111 (*)    AST 46 (*)    ALT 82 (*)    All other components within normal limits  CBC WITH DIFFERENTIAL/PLATELET  LIPASE, BLOOD  URINALYSIS, ROUTINE W REFLEX MICROSCOPIC  I-STAT CG4 LACTIC ACID, ED  I-STAT CG4 LACTIC ACID, ED    EKG None  Radiology Dg Chest 2 View  Result Date: 03/20/2018 CLINICAL DATA:  37 year old male with shortness of breath. EXAM: CHEST - 2 VIEW COMPARISON:  None. FINDINGS: The heart size and mediastinal contours are within normal limits. Both lungs are clear. The visualized skeletal structures are unremarkable. IMPRESSION: No active cardiopulmonary disease. Electronically Signed   By: Elgie Collard M.D.   On: 03/20/2018 22:15    Procedures Procedures (including critical care time)  Medications Ordered in ED Medications  sodium chloride 0.9 % bolus 1,000 mL (has no administration in time range)  acetaminophen (TYLENOL) tablet 650 mg (650 mg Oral Given 03/20/18 2118)     Initial Impression / Assessment and Plan / ED Course  I have reviewed the triage vital signs and the nursing notes.  Pertinent labs & imaging results that were available during my care of the patient were reviewed by me and considered in my medical decision making (see chart for details).  Patient presenting with complaints of pain in his right side/right lower quadrant for the past several days.  He was found to be febrile with a temperature of 100.9, however has no elevation of his white count.  He does have some tenderness to the lateral aspect of the abdomen.  A CT scan was obtained which shows no sign of acute abnormality.  I am uncertain as  to the exact etiology of his discomfort, however nothing appears surgical or emergent.  I have considered pulmonary embolism, however there is no hypoxia, tachycardia, or tachypnea.  At this point, I feel as though he is appropriate for discharge with outpatient follow-up and return as needed.  Final Clinical Impressions(s) / ED Diagnoses   Final diagnoses:  None    ED Discharge Orders    None       Geoffery Lyons, MD 03/21/18 [phone]

## 2018-08-27 ENCOUNTER — Encounter: Payer: Self-pay | Admitting: Primary Care

## 2018-08-27 ENCOUNTER — Ambulatory Visit: Payer: BC Managed Care – PPO | Admitting: Primary Care

## 2018-08-27 VITALS — BP 140/94 | HR 95 | Temp 98.1°F | Ht 68.0 in | Wt 213.5 lb

## 2018-08-27 DIAGNOSIS — F4323 Adjustment disorder with mixed anxiety and depressed mood: Secondary | ICD-10-CM | POA: Diagnosis not present

## 2018-08-27 DIAGNOSIS — I10 Essential (primary) hypertension: Secondary | ICD-10-CM

## 2018-08-27 DIAGNOSIS — Z23 Encounter for immunization: Secondary | ICD-10-CM | POA: Diagnosis not present

## 2018-08-27 MED ORDER — ESCITALOPRAM OXALATE 10 MG PO TABS: 10.0000 mg | ORAL_TABLET | Freq: Every day | ORAL | 1 refills | Status: DC

## 2018-08-27 NOTE — Assessment & Plan Note (Signed)
GAD 7 score of 19 and PHQ 9 score of 19 today. Denies SI/HI. Discussed options for treatment, he agrees to both therapy and medication which I believe to be appropriate.   Rx for Lexapro 10 mg sent to pharmacy and referral placed to therapy. Patient is to take 1/2 tablet daily for 6 days, then advance to 1 full tablet thereafter. We discussed possible side effects of headache, GI upset, drowsiness, and SI/HI. If thoughts of SI/HI develop, we discussed to present to the emergency immediately. Patient verbalized understanding.   Follow up in 6 weeks for re-evaluation.

## 2018-08-27 NOTE — Progress Notes (Signed)
Subjective:    Patient ID: Mathew Jackson, male    DOB: March 08, 1981, 38 y.o.   MRN: [id number]  HPI  Mathew Jackson is a 38 year old male with a history of anxiety who presents today with a chief complaint of anxiety.  Symptoms of feeling anxious, emotional with difficulty controlling, daily worry, irritability, grieving the loss of his father and uncle over the last two years, martial problems. Symptoms have chronic for years, worse over the last 6 months.   He has never been treated with medication in the past. GAD 7 score of 19 and PH9 score of 21 today. He denies SI/HI.   Review of Systems  Respiratory: Negative for shortness of breath.   Cardiovascular: Negative for chest pain and palpitations.  Neurological: Negative for dizziness and headaches.  Psychiatric/Behavioral:       See HPI       Past Medical History:  Diagnosis Date  . Asthma   . Elevated blood pressure   . OSA (obstructive sleep apnea)   . Seasonal allergies      Social History   Socioeconomic History  . Marital status: Married    Spouse name: Not on file  . Number of children: Not on file  . Years of education: Not on file  . Highest education level: Not on file  Occupational History  . Not on file  Social Needs  . Financial resource strain: Not on file  . Food insecurity:    Worry: Not on file    Inability: Not on file  . Transportation needs:    Medical: Not on file    Non-medical: Not on file  Tobacco Use  . Smoking status: Never Smoker  . Smokeless tobacco: Current User    Types: Snuff  Substance and Sexual Activity  . Alcohol use: No    Alcohol/week: 0.0 standard drinks  . Drug use: No  . Sexual activity: Not on file  Lifestyle  . Physical activity:    Days per week: Not on file    Minutes per session: Not on file  . Stress: Not on file  Relationships  . Social connections:    Talks on phone: Not on file    Gets together: Not on file    Attends religious service: Not on file   Active member of club or organization: Not on file    Attends meetings of clubs or organizations: Not on file    Relationship status: Not on file  . Intimate partner violence:    Fear of current or ex partner: Not on file    Emotionally abused: Not on file    Physically abused: Not on file    Forced sexual activity: Not on file  Other Topics Concern  . Not on file  Social History Narrative   Married.   1 child.   Works at [employer].    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   Enjoys playing golf, traveling.     Past Surgical History:  Procedure Laterality Date  . NASAL SEPTUM SURGERY      Family History  Problem Relation Age of Onset  . Heart failure Father   . Hypertension Father   . Kidney cancer Father   . Heart attack Father   . Lung cancer Father   . Heart disease Paternal Uncle   . Heart disease Paternal Grandfather     No Known Allergies  No current outpatient medications on  file prior to visit.   No current facility-administered medications on file prior to visit.     BP (!) 140/94   Pulse 95   Temp 98.1 F (36.7 C) (Oral)   Ht 5\' 8"  (1.727 m)   Wt 213 lb 8 oz (96.8 kg)   SpO2 98%   BMI 32.46 kg/m    Objective:   Physical Exam  Constitutional: He appears well-nourished.  Neck: Neck supple.  Cardiovascular: Normal rate and regular rhythm.  Respiratory: Effort normal and breath sounds normal.  Skin: Skin is warm and dry.  Psychiatric: He has a normal mood and affect.           Assessment & Plan:

## 2018-08-27 NOTE — Assessment & Plan Note (Signed)
Above goal in the office today, also with prior readings. Do recommend we address this, will have him start monitoring home readings. Given that we are initiating SSRI today, we will initiate hypertensive treatment during an upcoming follow up visit.

## 2018-08-27 NOTE — Addendum Note (Signed)
Addended by: Tawnya Crook on: 08/27/2018 03:34 PM   Modules accepted: Orders

## 2018-08-27 NOTE — Patient Instructions (Signed)
Start escitalopram 10 mg tablets (Lexapro) for anxiety and depression. Start by taking 1/2 tablet daily for 6 days, then increase to 1 full tablet thereafter.  You will be contacted regarding your referral to therapy.  Please let us know if you have not been contacted within one week.   Start monitoring your blood pressure daily, around the same time of day, for the next several weeks.  Ensure that you have rested for 30 minutes prior to checking your blood pressure. Record your readings and bring them to your next visit.  Schedule a follow up visit for 6 weeks for re-evaluation of anxiety and depression.  It was a pleasure to see you today!

## 2018-09-20 ENCOUNTER — Ambulatory Visit: Payer: BC Managed Care – PPO | Admitting: Psychology

## 2018-09-20 DIAGNOSIS — F4323 Adjustment disorder with mixed anxiety and depressed mood: Secondary | ICD-10-CM | POA: Diagnosis not present

## 2018-10-08 ENCOUNTER — Other Ambulatory Visit: Payer: Self-pay

## 2018-10-08 ENCOUNTER — Encounter: Payer: Self-pay | Admitting: Primary Care

## 2018-10-08 ENCOUNTER — Ambulatory Visit: Payer: BC Managed Care – PPO | Admitting: Primary Care

## 2018-10-08 VITALS — BP 140/90 | HR 94 | Temp 98.3°F | Ht 68.0 in | Wt 207.5 lb

## 2018-10-08 DIAGNOSIS — I10 Essential (primary) hypertension: Secondary | ICD-10-CM

## 2018-10-08 DIAGNOSIS — F4323 Adjustment disorder with mixed anxiety and depressed mood: Secondary | ICD-10-CM | POA: Diagnosis not present

## 2018-10-08 DIAGNOSIS — J302 Other seasonal allergic rhinitis: Secondary | ICD-10-CM

## 2018-10-08 MED ORDER — ESCITALOPRAM OXALATE 10 MG PO TABS: 10.0000 mg | ORAL_TABLET | Freq: Every day | ORAL | 1 refills | Status: DC

## 2018-10-08 NOTE — Assessment & Plan Note (Signed)
Improved on Lexapro 10 mg, denies SI/HI. Seems to be doing well with the combination of Lexapro and therapy. Continue current regimen.

## 2018-10-08 NOTE — Assessment & Plan Note (Signed)
Intermittent. Using Allegra-D daily. Discussed to stop Allegra-D, start plain Allegra, Flonase if needed.  Discussed that Allegra-D can contribute to hypertension and cause rebound symptoms. He verbalized understanding.

## 2018-10-08 NOTE — Assessment & Plan Note (Signed)
Above goal today, also taking Allegra-D daily.  Will have him continue to work on diet and exercise, follow up in 3 months for BP check.

## 2018-10-08 NOTE — Patient Instructions (Signed)
Continue escitalopram (Lexapro) 10 mg daily.   Continue to monitor your blood pressure as discussed.   Stop taking Allegra-D, try plain Allegra, Zyrtec, or Xyzal.  Please schedule a follow up appointment in 3 months for blood pressure check.  It was a pleasure to see you today!

## 2018-10-08 NOTE — Progress Notes (Signed)
Subjective:    Patient ID: Mathew Jackson, male    DOB: 06/01/1981, 38 y.o.   MRN: [id number]  HPI  Mathew Jackson is a 38 year old male who presents today for follow up of anxiety and depression, and elevated blood pressure.  1) Anxiety and Depression: He was last evaluated on 08/27/18 for a two year history of feeling anxious, difficulty controlling worry with daily worry, irritability, etc. GAD 7 score of 19 and PHQ 9 score of 21 so he was treated with Lexapro 10 mg and referred to therapy.  Since his last visit he's feeling better. Positive effects include better concentration, more excitement about everyday life, decrease in worry, has more of a balance emotionally, feeling more happy. He has been to therapy once and feels that it went well.   He denies headaches, SI/HI, dizziness, SI/HI.   2) Elevated Blood Pressure: Noted to be elevated during his visit in early February 2020, also today. Since his last visit he's checked his BP a few times and is getting readings of 130's/80's. He's been working towards weight loss through healthier eating.   BP Readings from Last 3 Encounters:  10/08/18 140/90  08/27/18 (!) 140/94  03/21/18 105/70   Wt Readings from Last 3 Encounters:  10/08/18 207 lb 8 oz (94.1 kg)  08/27/18 213 lb 8 oz (96.8 kg)  03/20/18 212 lb (96.2 kg)     Review of Systems  Cardiovascular: Negative for chest pain.  Gastrointestinal: Negative for nausea.  Allergic/Immunologic: Positive for environmental allergies.  Neurological: Negative for headaches.  Psychiatric/Behavioral:       See HPI       Past Medical History:  Diagnosis Date  . Asthma   . Elevated blood pressure   . OSA (obstructive sleep apnea)   . Seasonal allergies      Social History   Socioeconomic History  . Marital status: Married    Spouse name: Not on file  . Number of children: Not on file  . Years of education: Not on file  . Highest education level: Not on file  Occupational  History  . Not on file  Social Needs  . Financial resource strain: Not on file  . Food insecurity:    Worry: Not on file    Inability: Not on file  . Transportation needs:    Medical: Not on file    Non-medical: Not on file  Tobacco Use  . Smoking status: Never Smoker  . Smokeless tobacco: Current User    Types: Snuff  Substance and Sexual Activity  . Alcohol use: No    Alcohol/week: 0.0 standard drinks  . Drug use: No  . Sexual activity: Not on file  Lifestyle  . Physical activity:    Days per week: Not on file    Minutes per session: Not on file  . Stress: Not on file  Relationships  . Social connections:    Talks on phone: Not on file    Gets together: Not on file    Attends religious service: Not on file    Active member of club or organization: Not on file    Attends meetings of clubs or organizations: Not on file    Relationship status: Not on file  . Intimate partner violence:    Fear of current or ex partner: Not on file    Emotionally abused: Not on file    Physically abused: Not on file    Forced sexual activity: Not on  file  Other Topics Concern  . Not on file  Social History Narrative   Married.   1 child.   Works at [employer].    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   Enjoys playing golf, traveling.     Past Surgical History:  Procedure Laterality Date  . NASAL SEPTUM SURGERY      Family History  Problem Relation Age of Onset  . Heart failure Father   . Hypertension Father   . Kidney cancer Father   . Heart attack Father   . Lung cancer Father   . Heart disease Paternal Uncle   . Heart disease Paternal Grandfather     No Known Allergies  No current outpatient medications on file prior to visit.   No current facility-administered medications on file prior to visit.     BP 140/90   Pulse 94   Temp 98.3 F (36.8 C) (Oral)   Ht 5\' 8"  (1.727 m)   Wt 207 lb 8 oz (94.1 kg)   SpO2 97%   BMI 31.55 kg/m      Objective:   Physical Exam  Constitutional: He appears well-nourished.  Neck: Neck supple.  Cardiovascular: Normal rate and regular rhythm.  Respiratory: Effort normal and breath sounds normal.  Skin: Skin is warm and dry.  Psychiatric: He has a normal mood and affect.           Assessment & Plan:

## 2018-10-11 ENCOUNTER — Other Ambulatory Visit: Payer: Self-pay

## 2018-10-11 ENCOUNTER — Ambulatory Visit: Payer: BC Managed Care – PPO | Admitting: Psychology

## 2018-10-11 DIAGNOSIS — F4323 Adjustment disorder with mixed anxiety and depressed mood: Secondary | ICD-10-CM | POA: Diagnosis not present

## 2019-01-10 ENCOUNTER — Ambulatory Visit: Payer: Self-pay | Admitting: Primary Care

## 2019-03-16 IMAGING — DX DG CHEST 2V
2 series · 2 of 2 positions shown · non-contrast
Comparison: None.

CLINICAL DATA: 36-year-old male with shortness of breath.

EXAM:
CHEST - 2 VIEW

[chest pa]
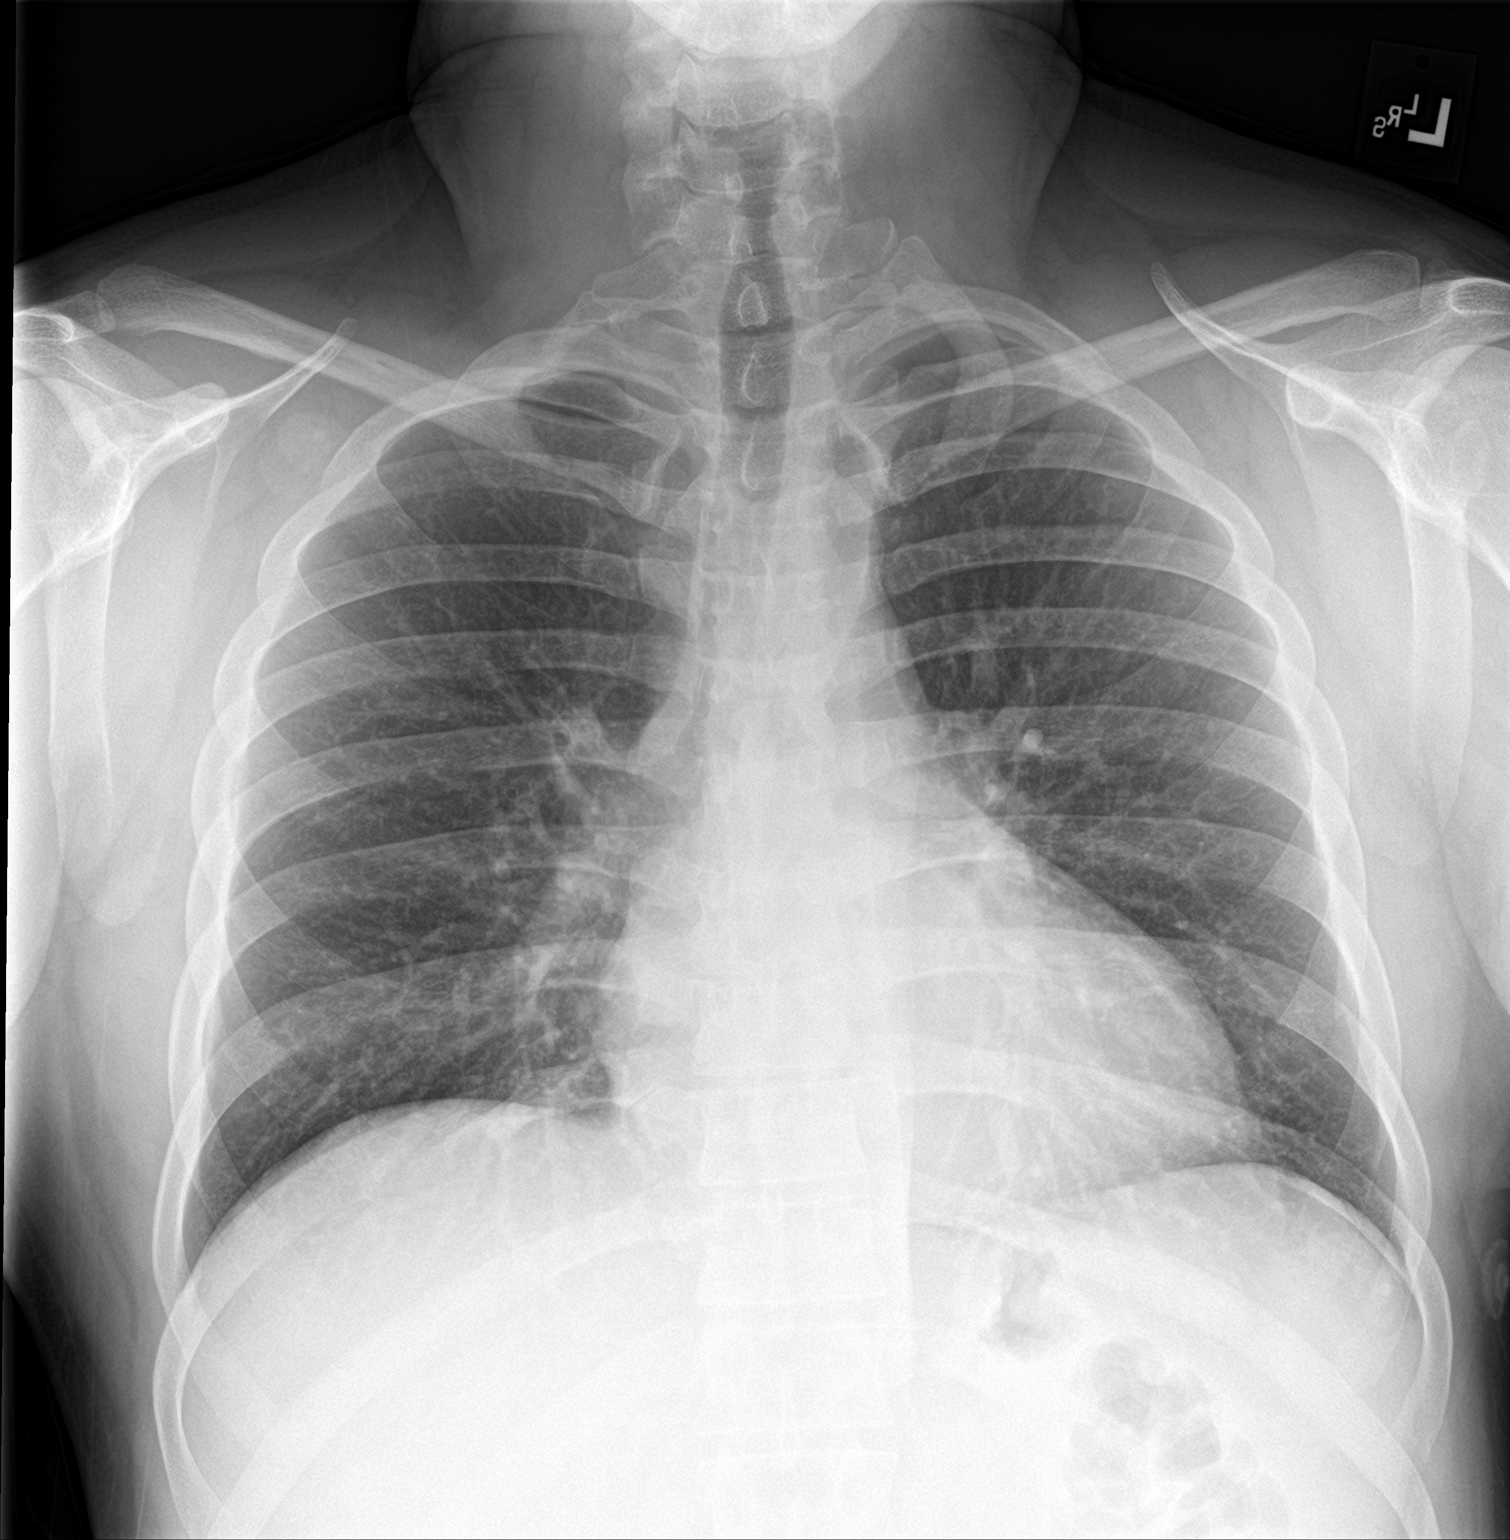

[chest lat]
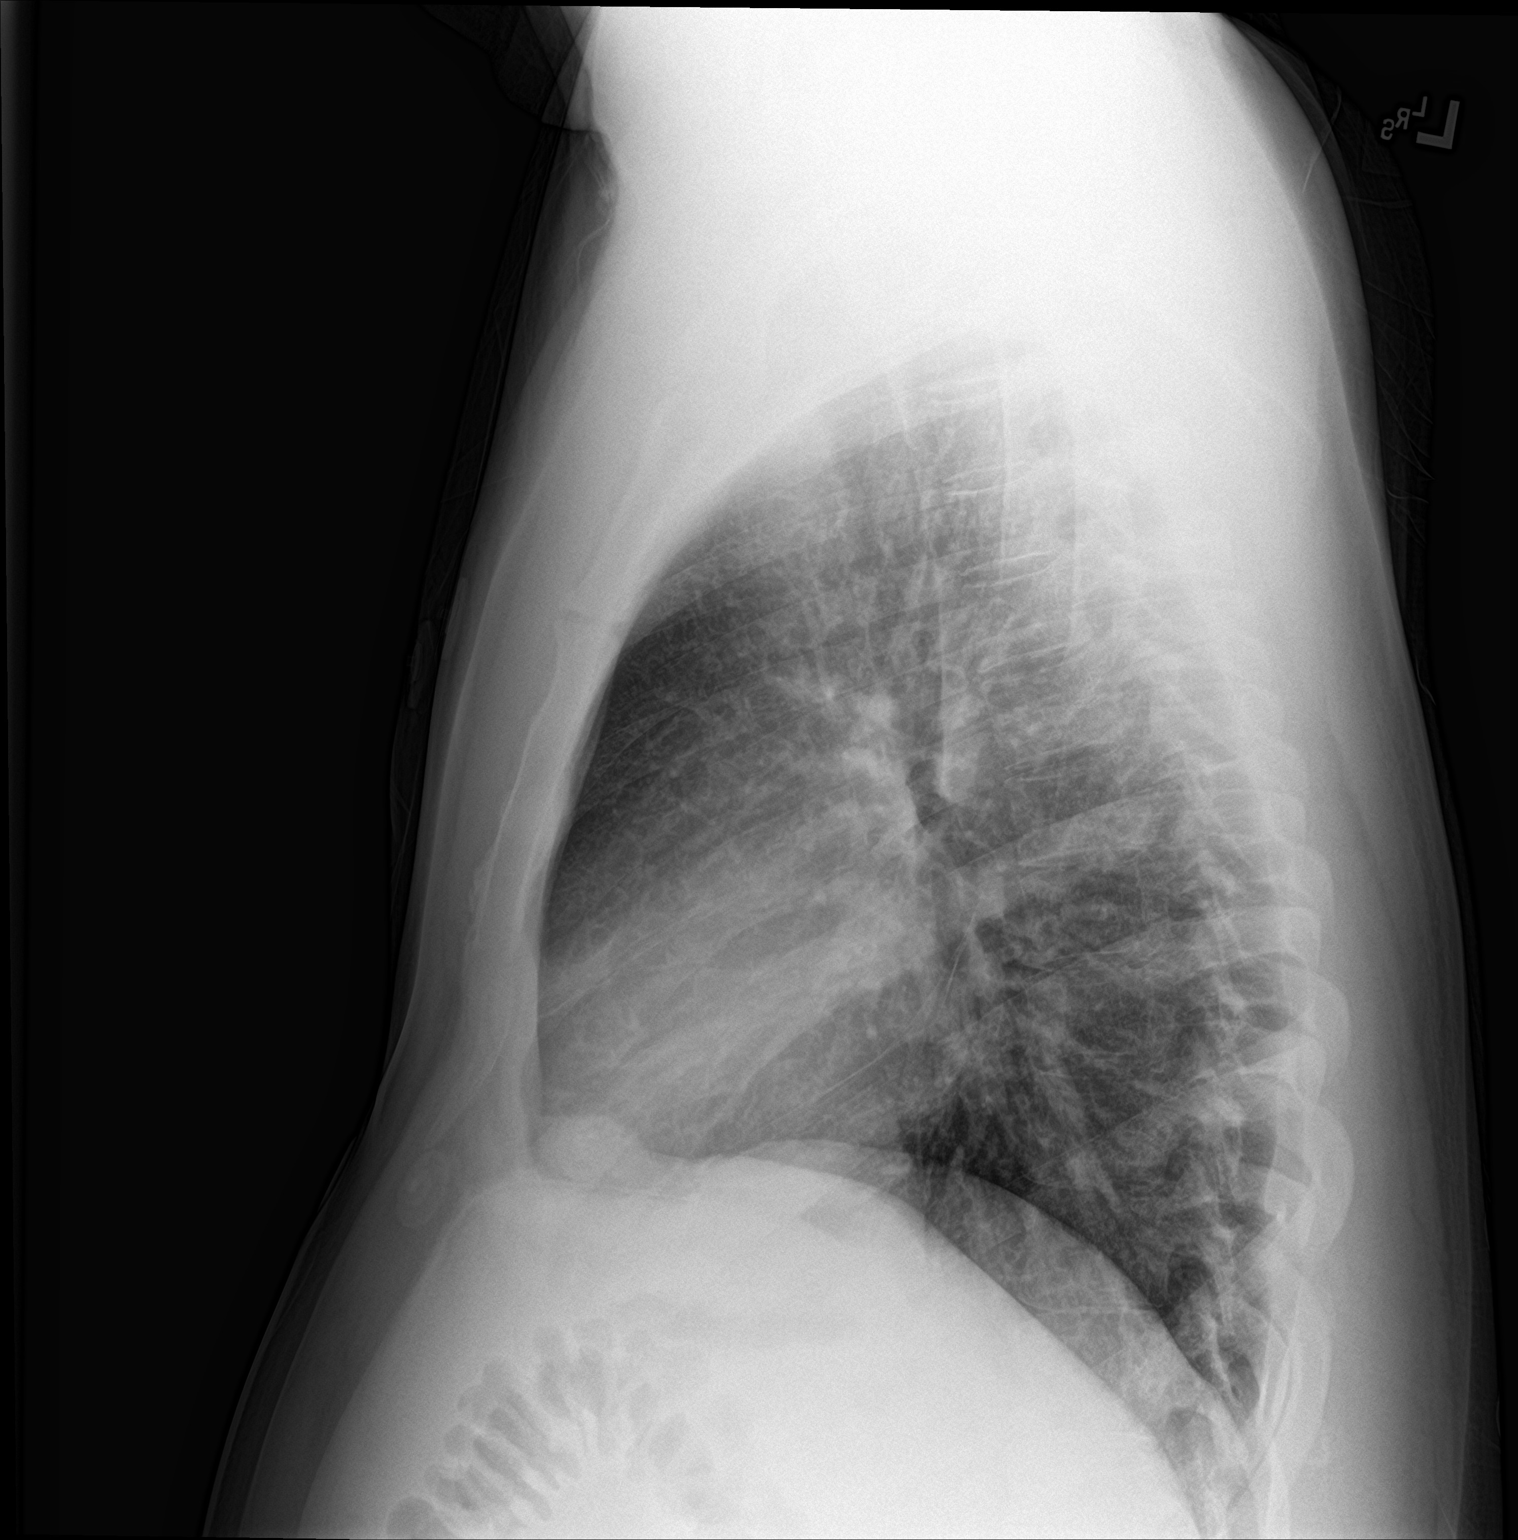

[2 of 2 positions shown; findings below may reference images not displayed]

FINDINGS: The heart size and mediastinal contours are within normal limits.
Both lungs are clear. The visualized skeletal structures are
unremarkable.
IMPRESSION: No active cardiopulmonary disease.

## 2019-03-17 IMAGING — CT CT ABD-PELV W/ CM
2 of 4 series · 16 of 46 positions shown, 18 images · IV contrast (APPLIED)
Comparison: None.

CLINICAL DATA: 36-year-old male with abdominal pain. Concern for
acute appendicitis.

EXAM:
CT ABDOMEN AND PELVIS WITH CONTRAST
TECHNIQUE: Multidetector CT imaging of the abdomen and pelvis was performed
using the standard protocol following bolus administration of
intravenous contrast.
CONTRAST:  100mL 4C45LP-NII IOPAMIDOL (4C45LP-NII) INJECTION 61%

[Series 3: abd/ pelvis 5.0 i30f 2 · axial · 0.89mm/px · z∈[+683,+1158]mm · 13 of 105 slices shown, 15 images]
[im 5/105  soft-tissue]
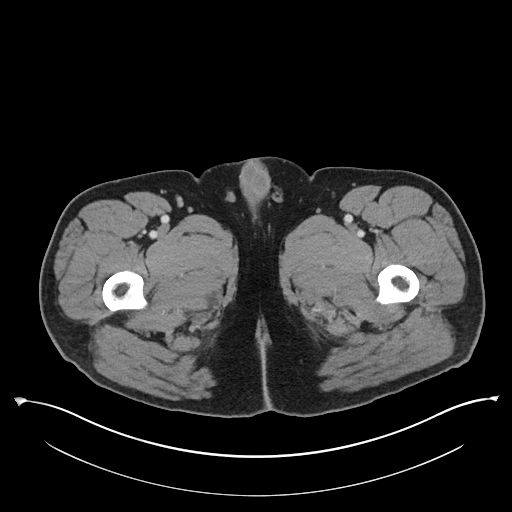
[im 5/105  bone]
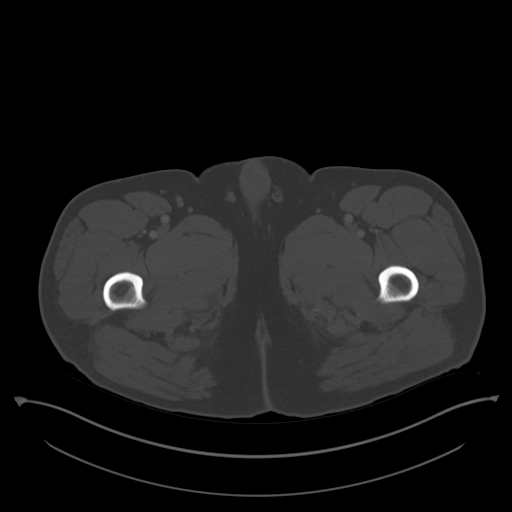
[im 14/105  soft-tissue]
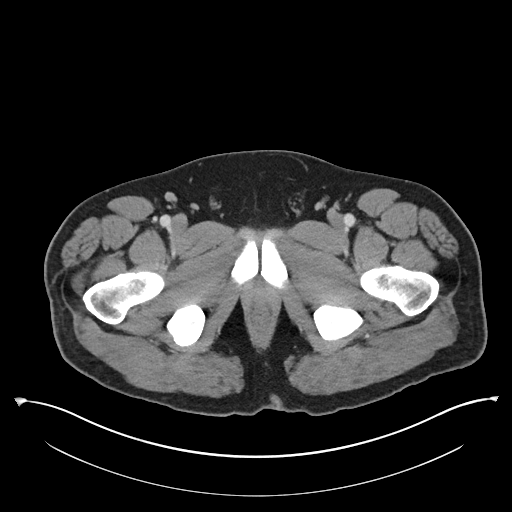
[im 23/105  soft-tissue]
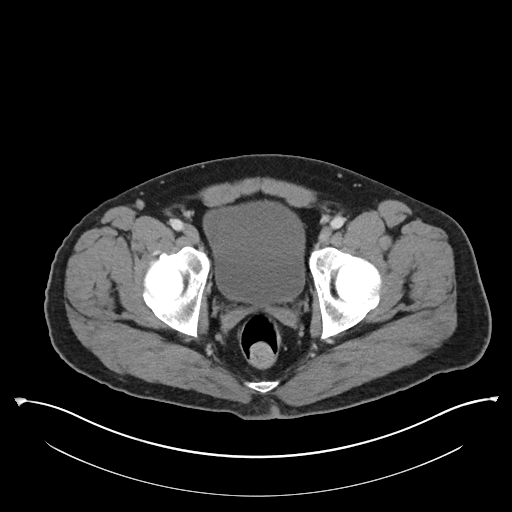
[im 28/105  soft-tissue]
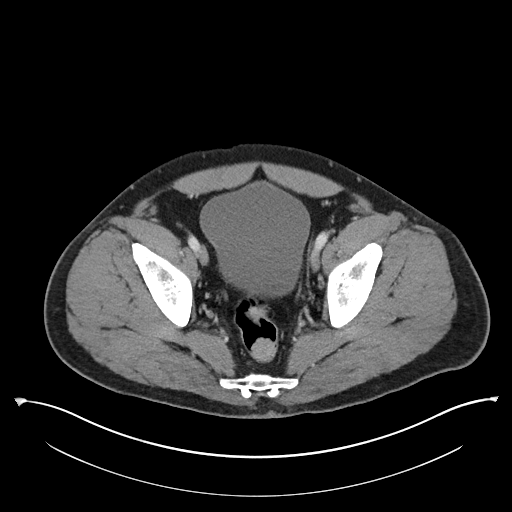
[im 37/105  soft-tissue]
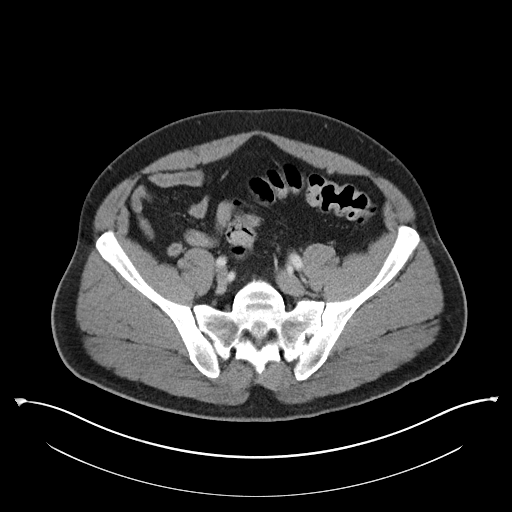
[im 46/105  soft-tissue]
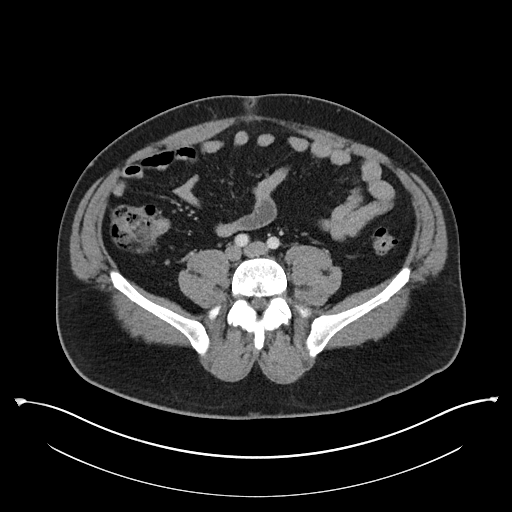
[im 55/105  soft-tissue]
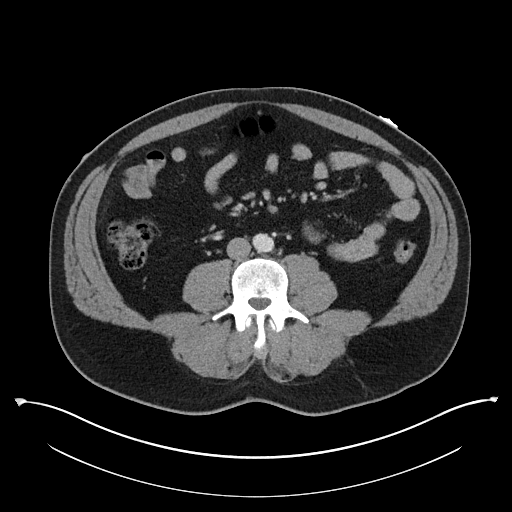
[im 59/105  soft-tissue]
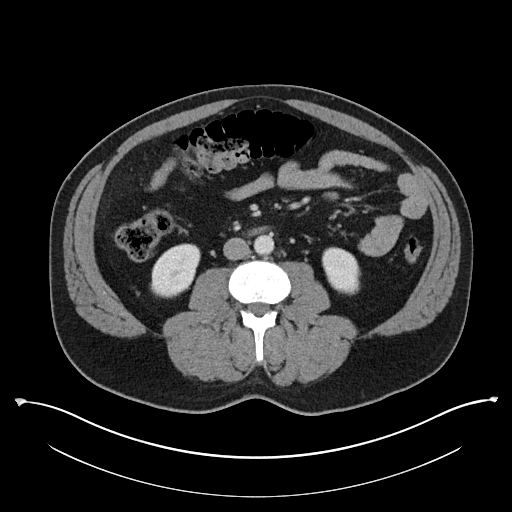
[im 68/105  soft-tissue]
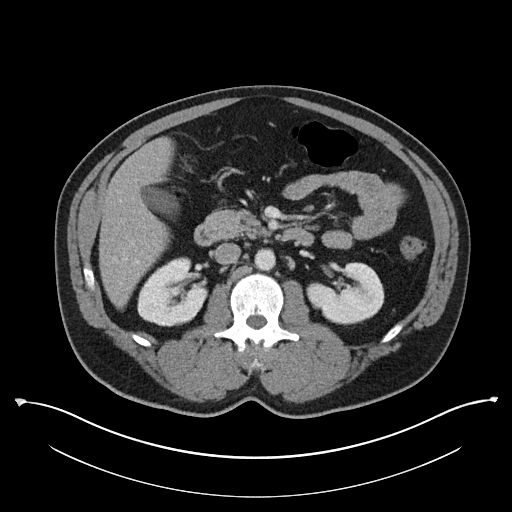
[im 68/105  bone]
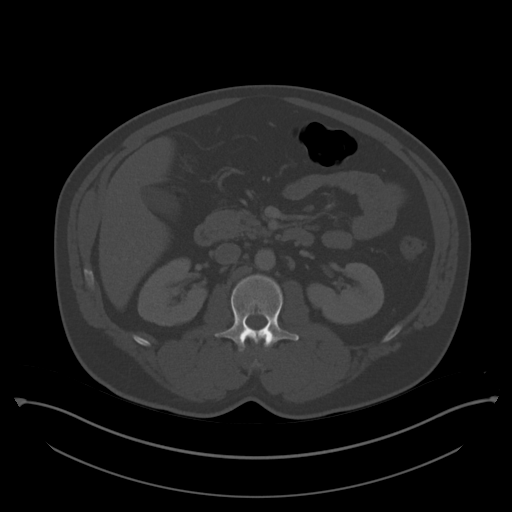
[im 77/105  soft-tissue]
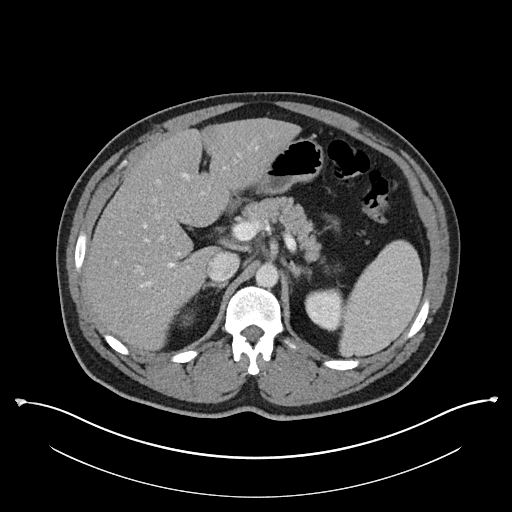
[im 82/105  soft-tissue]
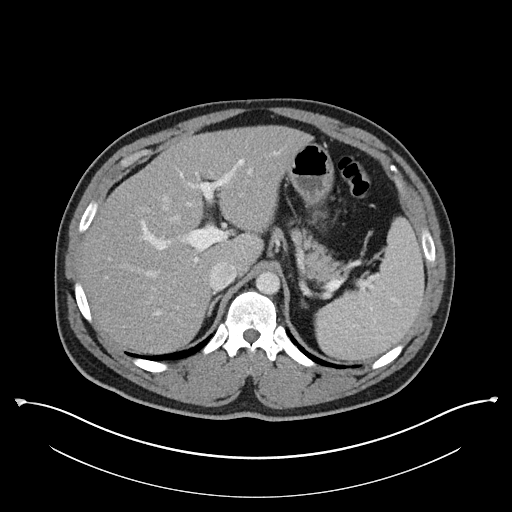
[im 91/105  soft-tissue]
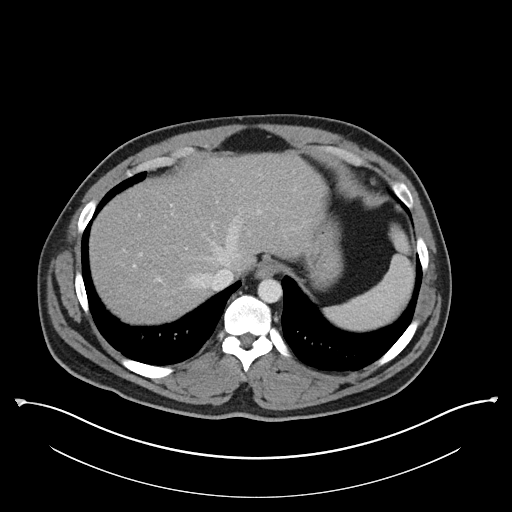
[im 100/105  soft-tissue]
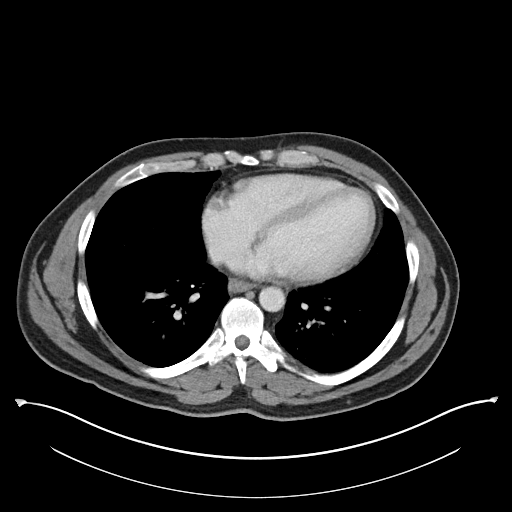

[Series 6: coronal soft tissue · coronal · 0.83mm/px · 3 of 112 slices shown]
[im 38/112  soft-tissue]
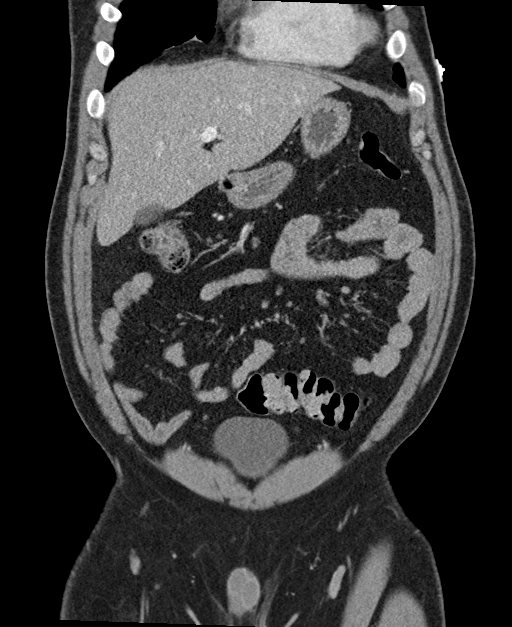
[im 50/112  soft-tissue]
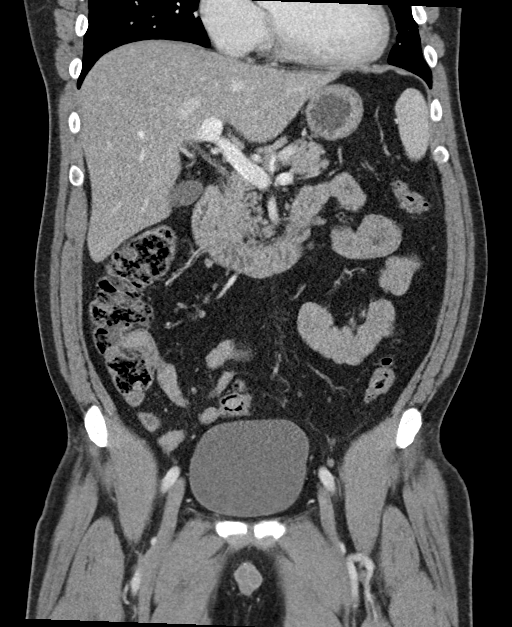
[im 62/112  soft-tissue]
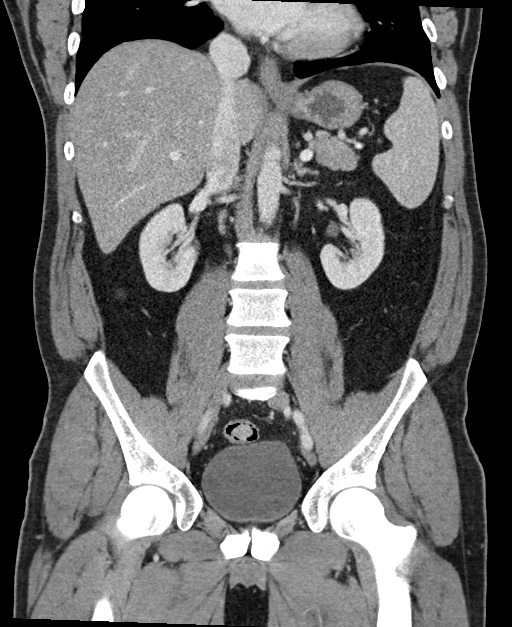

[16 of 46 positions shown; findings below may reference images not displayed]

FINDINGS: Lower chest: Minimal bibasilar dependent atelectatic changes. The
visualized lung bases are otherwise clear.

No intra-abdominal free air or free fluid.

Hepatobiliary: No focal liver abnormality is seen. No gallstones,
gallbladder wall thickening, or biliary dilatation.

Pancreas: Unremarkable. No pancreatic ductal dilatation or
surrounding inflammatory changes.

Spleen: Normal in size without focal abnormality.

Adrenals/Urinary Tract: Adrenal glands are unremarkable. Kidneys are
normal, without renal calculi, focal lesion, or hydronephrosis.
Bladder is unremarkable.

Stomach/Bowel: Stomach is within normal limits. Appendix appears
normal. No evidence of bowel wall thickening, distention, or
inflammatory changes.

Vascular/Lymphatic: No significant vascular findings are present. No
enlarged abdominal or pelvic lymph nodes.

Reproductive: The prostate and seminal vesicles are grossly
unremarkable.

Other: None

Musculoskeletal: No acute or significant osseous findings.
IMPRESSION: No acute intra-abdominal or pelvic pathology. No bowel obstruction
or active inflammation. Normal appendix.

## 2019-07-30 ENCOUNTER — Other Ambulatory Visit: Payer: BC Managed Care – PPO

## 2019-07-30 ENCOUNTER — Ambulatory Visit: Payer: BC Managed Care – PPO | Attending: Internal Medicine

## 2019-07-30 DIAGNOSIS — Z20822 Contact with and (suspected) exposure to covid-19: Secondary | ICD-10-CM

## 2019-08-01 LAB — NOVEL CORONAVIRUS, NAA: SARS-CoV-2, NAA: DETECTED — AB

## 2019-11-29 ENCOUNTER — Ambulatory Visit: Payer: BC Managed Care – PPO | Attending: Internal Medicine

## 2019-11-29 DIAGNOSIS — Z23 Encounter for immunization: Secondary | ICD-10-CM

## 2019-11-29 NOTE — Progress Notes (Signed)
   Covid-19 Vaccination Clinic  Name:  Win Guajardo    MRN: [id number] DOB: Jun 23, 1981  11/29/2019  Mr. Zelman was observed post Covid-19 immunization for 15 minutes without incident. He was provided with Vaccine Information Sheet and instruction to access the V-Safe system.   Mr. Bosket was instructed to call 911 with any severe reactions post vaccine: Marland Kitchen Difficulty breathing  . Swelling of face and throat  . A fast heartbeat  . A bad rash all over body  . Dizziness and weakness   Immunizations Administered    Name Date Dose VIS Date Route   Pfizer COVID-19 Vaccine 11/29/2019  1:42 PM 0.3 mL 09/18/2018 Intramuscular   Manufacturer: [employer], [employer]   Lot: [id number]   NDC: [address]-0

## 2019-12-20 ENCOUNTER — Ambulatory Visit: Payer: BC Managed Care – PPO | Attending: Internal Medicine

## 2019-12-20 DIAGNOSIS — Z23 Encounter for immunization: Secondary | ICD-10-CM

## 2019-12-20 NOTE — Progress Notes (Signed)
   Covid-19 Vaccination Clinic  Name:  Mathew Jackson    MRN: [id number] DOB: 13-Jun-1981  12/20/2019  [name] was observed post Covid-19 immunization for 15 minutes without incident. He was provided with Vaccine Information Sheet and instruction to access the V-Safe system.   [name] was instructed to call 911 with any severe reactions post vaccine: Marland Kitchen Difficulty breathing  . Swelling of face and throat  . A fast heartbeat  . A bad rash all over body  . Dizziness and weakness   Immunizations Administered    Name Date Dose VIS Date Route   Pfizer COVID-19 Vaccine 12/20/2019 10:05 AM 0.3 mL 09/18/2018 Intramuscular   Manufacturer: [employer], [employer]   Lot: [id number]   NDC: [address]-6

## 2019-12-31 ENCOUNTER — Telehealth: Payer: Self-pay

## 2019-12-31 NOTE — Telephone Encounter (Signed)
Received cpap supplies Rx from [facility].  Per our records, pt was last seen 12/29/2015.  Pt will need an appt prior to Rx being refilled.  Lm for pt.

## 2020-01-01 NOTE — Telephone Encounter (Signed)
ATC pt- unable to leave vm, due to mailbox being full.  

## 2020-01-02 NOTE — Telephone Encounter (Signed)
ATC pt x3- unable to leave vm due to mailbox being full. Letter has been mailed to address on file. Will close encounter per office protocol.

## 2020-03-18 ENCOUNTER — Other Ambulatory Visit: Payer: BC Managed Care – PPO

## 2021-02-25 ENCOUNTER — Telehealth: Payer: Self-pay

## 2021-02-25 NOTE — Telephone Encounter (Signed)
Vm from pt stating he tested pos for COVID and wants to discuss antiviral med.  Spoke with pt asking about sxs.  States he had pos home COVID test this morning.  Son tested pos on 02/23/21.  C/o fever- max 99.7, body aches and HA.  Sxs started 02/24/21.  Wants to discuss antiviral med.  Offered video visit.  Pt agrees.   Per Jae Dire, can add pt tomorrow.  Scheduled MyChart visit 02/26/21 at 11:20.  Notified pt, per Kate's request, to upload photo of pos home COVID test.  Pt verbalizes understanding.

## 2021-02-26 ENCOUNTER — Encounter: Payer: Self-pay | Admitting: Primary Care

## 2021-02-26 ENCOUNTER — Telehealth (INDEPENDENT_AMBULATORY_CARE_PROVIDER_SITE_OTHER): Payer: Self-pay | Admitting: Primary Care

## 2021-02-26 VITALS — Temp 98.8°F | Ht 68.0 in

## 2021-02-26 DIAGNOSIS — U071 COVID-19: Secondary | ICD-10-CM

## 2021-02-26 MED ORDER — MOLNUPIRAVIR EUA 200MG CAPSULE: 4.0000 | ORAL_CAPSULE | Freq: Two times a day (BID) | ORAL | 0 refills | Status: AC

## 2021-02-26 NOTE — Patient Instructions (Signed)
  Start the molnupiravir 200 mg tablets for Covid-19 infection. Take 4 capsules by mouth twice daily for five days.  It was a pleasure to see you today!

## 2021-02-26 NOTE — Assessment & Plan Note (Addendum)
Positive result yesterday, symptom onset two days ago. He will attach his positive test result.  Within window for treatment, does have a medical history for which would qualify him for treatment.  No recent renal function on file.  Rx for molnupiravir 200 mg sent to pharmacy. Discussed instructions for administration.   Return precautions provided.

## 2021-02-26 NOTE — Progress Notes (Signed)
Patient ID: Mathew Jackson, male    DOB: 1981/07/25, 40 y.o.   MRN: [id number]  Virtual visit completed through [employer], a video enabled telemedicine application. Due to national recommendations of social distancing due to COVID-19, a virtual visit is felt to be most appropriate for this patient at this time. Reviewed limitations, risks, security and privacy concerns of performing a virtual visit and the availability of in person appointments. I also reviewed that there may be a patient responsible charge related to this service. The patient agreed to proceed.   Patient location: home Provider location: [place] [facility], office Persons participating in this virtual visit: patient, provider   If any vitals were documented, they were collected by patient at home unless specified below.    Temp 98.8 F (37.1 C) (Temporal)   Ht 5\' 8"  (1.727 m)   BMI 31.55 kg/m    CC: Positive Covid Subjective:   HPI: Mathew Jackson is a 40 y.o. male with a history of asthma, OSA, seasonal allergies presenting on 02/26/2021 for to discuss Covid-19 diagnosis.  Symptoms began 02/24/21 with body aches, fever, and congestion. He tested for Covid-19 on a home test yesterday which was positive. He's been taking Tylenol for fevers, chills, and body aches with improvement. Today he's feeling better, but also has developed new symptoms of nasal congestion.   He is interested in antiviral treatment given his history of asthma, hypertension, and OSA.         Relevant past medical, surgical, family and social history reviewed and updated as indicated. Interim medical history since our last visit reviewed. Allergies and medications reviewed and updated. Outpatient Medications Prior to Visit  Medication Sig Dispense Refill   escitalopram (LEXAPRO) 10 MG tablet Take 1 tablet (10 mg total) by mouth daily. For anxiety and depression. (Patient not taking: Reported on 02/26/2021) 90 tablet 1   No  facility-administered medications prior to visit.     Per HPI unless specifically indicated in ROS section below Review of Systems  Constitutional:  Positive for chills and fever.  HENT:  Positive for congestion.   Respiratory:  Positive for cough. Negative for shortness of breath.   Musculoskeletal:  Positive for myalgias.  Neurological:  Negative for headaches.  Objective:  Temp 98.8 F (37.1 C) (Temporal)   Ht 5\' 8"  (1.727 m)   BMI 31.55 kg/m   Wt Readings from Last 3 Encounters:  10/08/18 207 lb 8 oz (94.1 kg)  08/27/18 213 lb 8 oz (96.8 kg)  03/20/18 212 lb (96.2 kg)       Physical exam: Gen: alert, NAD,appears ill  Pulm: speaks in complete sentences without increased work of breathing, no cough during visit Psych: normal mood, normal thought content      Results for orders placed or performed in visit on 07/30/19  Novel Coronavirus, NAA ([facility])   Specimen: Nasopharyngeal(NP) swabs in vial transport medium   NASOPHARYNGE  TESTING  Result Value Ref Range   SARS-CoV-2, NAA Detected (A) Not Detected   Assessment & Plan:   Problem List Items Addressed This Visit       Other   COVID-19 virus infection - Primary    Positive result yesterday, symptom onset two days ago. He will attach his positive test result.  Within window for treatment, does have a medical history for which would qualify him for treatment.  No recent renal function on file.  Rx for molnupiravir 200 mg sent to pharmacy. Discussed instructions for administration.  Return precautions provided.        Relevant Medications   molnupiravir EUA 200 mg CAPS     Meds ordered this encounter  Medications   molnupiravir EUA 200 mg CAPS    Sig: Take 4 capsules (800 mg total) by mouth 2 (two) times daily for 5 days.    Dispense:  40 capsule    Refill:  0    Order Specific Question:   Supervising Provider    Answer:   BEDSOLE, AMY E [[id number]]   No orders of the defined types were placed in this  encounter.   I discussed the assessment and treatment plan with the patient. The patient was provided an opportunity to ask questions and all were answered. The patient agreed with the plan and demonstrated an understanding of the instructions. The patient was advised to call back or seek an in-person evaluation if the symptoms worsen or if the condition fails to improve as anticipated.  Follow up plan:  Start the molnupiravir 200 mg tablets for Covid-19 infection. Take 4 capsules by mouth twice daily for five days.  It was a pleasure to see you today!   Doreene Nest, NP

## 2021-05-04 ENCOUNTER — Encounter: Payer: Self-pay | Admitting: Primary Care

## 2024-06-05 ENCOUNTER — Ambulatory Visit (INDEPENDENT_AMBULATORY_CARE_PROVIDER_SITE_OTHER): Admitting: Neurology

## 2024-06-05 ENCOUNTER — Encounter: Payer: Self-pay | Admitting: Neurology

## 2024-06-05 VITALS — BP 142/93 | HR 88 | Ht 68.0 in | Wt 203.0 lb

## 2024-06-05 DIAGNOSIS — R5383 Other fatigue: Secondary | ICD-10-CM | POA: Diagnosis not present

## 2024-06-05 DIAGNOSIS — J00 Acute nasopharyngitis [common cold]: Secondary | ICD-10-CM | POA: Diagnosis not present

## 2024-06-05 DIAGNOSIS — E663 Overweight: Secondary | ICD-10-CM

## 2024-06-05 DIAGNOSIS — G4733 Obstructive sleep apnea (adult) (pediatric): Secondary | ICD-10-CM | POA: Diagnosis not present

## 2024-06-05 NOTE — Progress Notes (Signed)
 @[facility]   Provider:  Dedra Gores, MD  Primary Care Physician:  Gretta Comer POUR, NP [address] [place] [address]  Referring Provider: Gretta Comer POUR, Np [address] [name],  [place] [address]        Chief Concern for this Consultation:   Patient presents with          HPI: I have the pleasure of meeting with Mathew Jackson , on 06/05/24 , who is a 43 y.o.  male patient,  seen upon a referral by   for a  Sleep Medicine Consultation.    The patient had a sleep study at [facility]  15 years ago,  using CPAP machine since 2010. The patient's referral information asked for a new testing and evaluation.     Chief concern according to patient:  I Just want to make sure I have a working machine and fitting mask . Before diagnosis, I was very sleepy, I fell asleep on a riding lawn mower ! I have  stood in the classroom  fighting to not fall sleep.  I felt so much better once I was diagnosed and treated.   I was able to review the current data download for this patient.  He has more air leakage and more apneas over the last months , after receiving a new mask, ordered on [employer].    He has a past medical history of Asthma, Elevated blood pressure, OSA (obstructive sleep apnea), and Seasonal allergies..  Sleep relevant medical/ surgical and symptom history:   ENT surgery : nasal septum surgery, 12 years ago, Sinusitis,  rhinitis seasonally.  This patient had a previous sleep study/ studies in the year 2010  at [facility]  with a resulting diagnosis of OSA. This patient has used the following therapies: CPAP    Family medical history: There are  biological family members affected by Sleep apnea : Brother and late father.    Social history: The patient is a Optometrist , lives in a private home, in a household with spouse and  2 children at home, has one cat .  The patient currently works in PE .The workplace involves physical activity, outdoor activity, travel.    Nicotine use: chewing   ETOH use: /,  Caffeine intake in form of: Coffee (/), Soft drinks (4 mountain dew a day), Tea ( /) or Energy drinks ( including those containing  taurine ). Caffeine is last consumed at dinner. .  Exercises regularly in AM  Coaches sports until 7 PM      Sleep habits and routines are as follows: The patient's dinner time is around 7-8 PM.   The patient goes to bed at, or close to, 9 PM. The bedroom is shared with spouse  and is described as cool, quiet, and dark.  He  gets diaphoretic each night.  The patient reports that it takes few  minutes to fall asleep, then continues to sleep for 7 hours, uninterrupted   The preferred sleep position is laterally , with support of 1-2 pillows, (non- adjustable bed/ ). The total estimated sleep time is circa 7 hours.  Dreams are reportedly infrequent/ and can be vivid.  Dream enactment has not been reported.   6.20  AM is the usual week- day rise time. The patient wakes up with an alarm set at 5 .45 AM, then snoozing it 3 times.  He reports mostly feeling refreshed and restored in the morning, waking with symptoms  such as dry mouth, rare morning headaches, and fatigue. Hip pain , low back pain.   No sleep paralysis has been experienced.  Naps in daytime are taken infrequently (there is no desire to nap and opportunity).  Review of Systems: Out of a complete 14 system review, the patient complains of only the following symptoms, and all other reviewed systems are negative.:     How likely are you to doze in the following situations: 0 = not likely, 1 = slight chance, 2 = moderate chance, 3 = high chance Sitting and Reading? Watching Television? Sitting inactive in a public place (theater or meeting)? As a passenger in a car for an hour without a break? Lying down in the afternoon when circumstances permit? Sitting and talking to someone? Sitting quietly after lunch without alcohol? In a car, while stopped for a few minutes  in traffic?   Total ESS =8 / 24 points.    FSS endorsed at 42/ 63 points.   GDS:  Social History   Socioeconomic History   Marital status: Married    Spouse name: Not on file   Number of children: Not on file   Years of education: 16   Highest education level: Bachelor, in PE   Occupational History   Not on file  Tobacco Use   Smoking status: Never   Smokeless tobacco: Current    Types: Snuff  Vaping Use   Vaping status: Never Used  Substance and Sexual Activity   Alcohol use: No    Alcohol/week: 0.0 standard drinks of alcohol   Drug use: No   Sexual activity: Not on file  Other Topics Concern   Not on file  Social History Narrative   Married.   1 child.   Works at [employer].    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   Enjoys playing golf, traveling.    Social Drivers of Corporate Investment Banker Strain: Not on file  Food Insecurity: Not on file  Transportation Needs: Not on file  Physical Activity: Not on file  Stress: Not on file  Social Connections: Not on file    Family History  Problem Relation Age of Onset   Heart failure Father    Hypertension Father    Kidney cancer Father    Heart attack Father    Lung cancer Father    Heart disease Paternal Uncle    Heart disease Paternal Grandfather     Past Medical History:  Diagnosis Date   Asthma    Elevated blood pressure    OSA (obstructive sleep apnea)    Seasonal allergies     Past Surgical History:  Procedure Laterality Date   NASAL SEPTUM SURGERY  2013     No current outpatient medications on file prior to visit.   No current facility-administered medications on file prior to visit.    No Known Allergies  Vitals:   06/05/24 1032  BP: (!) 142/93  Pulse: 88       Physical exam:   General: The patient was alert and appears not in acute distress.  Mood and affect are appropriate .  The patient's interactions are: Cooperative, makes eye contact,  follows the instructions and answers questions coherently.  The patient is groomed and appropriately groomed and dressed. Head: Normocephalic, atraumatic.  Neck is supple.  Facial hair.  Mallampati: 3/ .  The neck circumference measured 17.5 inches. Nasal airflow was not patent ,  Overbite / Retrognathia was noted.  Dental status: biological , wore braces  Cardiovascular:  Regular rate and cardiac rhythm by palpable pulse.  No bruits.  Respiratory: no audible wheezing, no tachypnoea.   Skin:  Without evidence of ankle edema. No discoloration.  Trunk:  BMI is 30.9 The patient's posture was erect.   Neurologic exam : The patient was awake and alert, oriented to place and time.   Attention span & concentration ability appeared normal.  Speech was fluent, without dysarthria, dysphonia or aphasia, and of normal volume.     Cranial nerves:  There was no loss of smell or taste reported  Pupils are round, equal in size and briskly reactive to light.  Funduscopic exam was deferred.  Extraocular movements in vertical and horizontal planes were intact and without nystagmus. (No Diplopia reported). Visual fields by finger perimetry are intact. Hearing was intact to tuning fork and voice.   Facial sensation intact to fine touch.  Facial motor strength: Symmetric movement and tongue and uvula move midline.  Neck ROM: rotation, tilt and flexion extension were intact for age and shoulder shrug was symmetrical.    Motor exam:  Symmetric bulk, strength and ROM.   Normal tone without cog- wheeling, and symmetric grip strength.   Sensory:  Fine touch and vibration were tested by tuning fork and intact.     Coordination: The patient reported no problems with button closure and no changes to penmanship.    Gait and station: Patient could rise unassisted from a seated position, without bracing, and walked without assistive device.  Stance was of normal/  width.    Deep tendon reflexes: Upper  and lower  extremities did show symmetric DTRs.      I would like to thank Gretta Comer POUR, Np [address] [place],  [place] [address] for allowing me to meet with Mathew Jackson today.    In short, he is presenting with OSA on CPAP, using the same machine continuously for 15 years.  Known OSA, diagnosed at a time when he weighted 230 pounds, 30 pounds more than now.  His study was a SPLIT study an he came home with a CPAP from there.  He likes to be at 180.  He is not excessively sleepy, not fatigued but recently has missed a restorative quality to his sleep.   Risk factors for OSA were present,  including : Body mass index is 30.87 kg/m., larger 18 neck size and upper airway anatomy.)    My Plan is to proceed with:  HST/ PSG : Both will serve to establish his new sleep apnea baseline , I prefer a watch pat over sansa .  Weight treatment :I initiated a  nutrionist referral to achieve the weight loss  in a patient who is physically  active but gaining weight. .   I noted no recent metabolic panels, no  primary fatigue work up for this patient in a while and ordered accordingly our panel for fatigue, CMET, CBC ,TSH. Vit D and B12.    I plan to follow up personally  within 3-6  months.   A total time of  45  minutes consistent of a part of face to face encounter , exam and interview,  and additional preparation time for chart review was spent .  At today's visit, we discussed treatment options, associated risk and benefits, and engage in counseling as needed including, but not limited to:  Sleep hygiene, Quality Sleep Habits,  and Safety concerns for patients with daytime sleepiness who are warned to not operate machinery/ motor vehicles when drowsy. Additionally, the following were reviewed: Past medical records, past medical and surgical history, family and social background, as well as relevant laboratory results, imaging findings, and medical notes, where applicable.  This note was  generated by myself in part by using dictation software, and as a result, it may contain unintentional typos and errors.  Nevertheless, effort was made to accurately convey the pertinent aspects of the patient's visit.   Dedra Gores, MD  [facility] Sleep Board certified in Sleep Medicine by [employer] of Sleep Medicine and Diplomate of the [employer] (AASM) . Board certified In Neurology, Diplomat of the ABPN,  Fellow of the [employer] of Neu[employer].

## 2024-06-05 NOTE — Progress Notes (Signed)
 Mathew Jackson

## 2024-06-05 NOTE — Patient Instructions (Addendum)
 I would like to thank Gretta Comer [facility], [address] [place],  [place] [address] for allowing me to meet with Mr Mathew Jackson today.      In short, he is presenting with OSA on CPAP, using the same machine continuously for 15 years.  Known OSA, diagnosed at a time when he weighted 230 pounds, 30 pounds more than now.  His study was a SPLIT study an he came home with a CPAP from there.  He likes to be at 180.  He is not excessively sleepy, not fatigued but recently has missed a restorative quality to his sleep.    Risk factors for OSA were present,  including : Body mass index is 30.87 kg/m., larger 18 neck size and upper airway anatomy.)     My Plan is to proceed with:   HST/ PSG : Both will serve to establish his new sleep apnea baseline , I prefer a watch pat over sansa .  Weight treatment :I initiated a  nutrionist referral to achieve the weight loss  in a patient who is physically  active but gaining weight. .   I noted no recent metabolic panels, no  primary fatigue work up for this patient in a while and ordered accordingly our panel for fatigue, CMET, CBC ,TSH. Vit D and B12.      I plan to follow up personally  within 3-6  months.    A total time of  45  minutes consistent of a part of face to face encounter , exam and interview,  and additional preparation time for chart review was spent .  At today's visit, we discussed treatment options, associated risk and benefits, and engage in counseling as needed including, but not limited to:  Sleep hygiene, Quality Sleep Habits, and Safety concerns for patients with daytime sleepiness who are warned to not operate machinery/ motor vehicles when drowsy. Additionally, the following were reviewed: Past medical records, past medical and surgical history, family and social background, as well as relevant laboratory results, imaging findings, and medical notes, where applicable.  This note was generated by myself in part by using dictation  software, and as a result, it may contain unintentional typos and errors.  Nevertheless, effort was made to accurately convey the pertinent aspects of the patient's visit.   Living With Sleep Apnea Sleep apnea is a condition that affects your breathing while you're sleeping. Your tongue or the tissue in your throat may block the flow of air while you sleep. You may have shallow breathing or stop breathing for short periods of time. The breaks in breathing interrupt the deep sleep that you need to feel rested. Even if you don't wake up from the gaps in breathing, you may feel tired during the day. People with sleep apnea may snore loudly. You may have a headache in the morning and feel anxious or depressed. How can sleep apnea affect me? Sleep apnea increases your chances of being very tired during the day. This is called daytime fatigue. Sleep apnea can also increase your risk of: Heart attack. Stroke. Obesity. Type 2 diabetes. Heart failure. Irregular heartbeat. High blood pressure. If you are very tired during the day, you may be more likely to: Not do well in school or at work. Fall asleep while driving. Have trouble paying attention. Develop depression or anxiety. Have problems having sex. This is called sexual dysfunction. What actions can I take to manage sleep apnea? Sleep apnea treatment  If you were given a device to open your airway while you sleep, use it only as told by your health care provider. You may be given: An oral appliance. This is a mouthpiece that shifts your lower jaw forward. A continuous positive airway pressure (CPAP) device. This blows air through a mask. A nasal expiratory positive airway pressure (EPAP) device. This has valves that you put into each nostril. A bi-level positive airway pressure (BIPAP) device. This blows air through a mask when you breathe in and breathe out. You may need surgery if other treatments don't work for you. Sleep habits Go to  sleep and wake up at the same time every day. This helps set your internal clock for sleeping. If you stay up later than usual on weekends, try to get up in the morning within 2 hours of the time you usually wake up. Try to get at least 7-9 hours of sleep each night. Stop using a computer, tablet, and mobile phone a few hours before bedtime. Do not take long naps during the day. If you nap, limit it to 30 minutes. Have a relaxing bedtime routine. Reading or listening to music may relax you and help you sleep. Use your bedroom only for sleep. Keep your television and computer out of your bedroom. Keep your bedroom cool, dark, and quiet. Use a supportive mattress and pillows. Follow your provider's instructions for other changes to sleep habits. Nutrition Do not eat big meals in the evening. Do not have caffeine in the later part of the day. The effects of caffeine can last for more than 5 hours. Follow your provider's instructions for any changes to what you eat and drink. Lifestyle Do not drink alcohol before bedtime. Alcohol can cause you to fall asleep at first, but then it can cause you to wake up in the middle of the night and have trouble getting back to sleep. Do not smoke, vape, or use nicotine or tobacco. Medicines Take over-the-counter and prescription medicines only as told by your provider. Do not use over-the-counter sleep medicine. You may become dependent on this medicine, and it can make sleep apnea worse. Do not take medicines, such as sedatives and narcotics, unless told to by your provider. Activity Exercise on most days, but avoid exercising in the evening. Exercising near bedtime can interfere with sleeping. If possible, spend time outside every day. Natural light helps with your internal clock. General information Lose weight if you need to. Stay at a healthy weight. If you are having surgery, make sure to tell your provider that you have sleep apnea. You may need to  bring your device with you. Keep all follow-up visits. Your provider will want to check on your condition. Where to find more information National Heart, Lung, and Blood Institute: buffalodrycleaner.gl This information is not intended to replace advice given to you by your health care provider. Make sure you discuss any questions you have with your health care provider. Document Revised: 11/02/2022 Document Reviewed: 11/02/2022 Elsevier Patient Education  2024 [employer]  Healthy Living: Sleep In this video, you will learn why sleep is an important part of a healthy lifestyle. To view the content, go to this web address: [email or web address].elsevier.[email or web address]  This video will expire on: 07/05/2025. If you need access to this video following this date, please reach out to the healthcare provider who assigned it to you. This information is not intended to replace advice given to you by your health care provider. Make sure you discuss  any questions you have with your health care provider. Elsevier Patient Education  2024 [employer].Fatigue If you have fatigue, you feel tired all the time and have a lack of energy or a lack of motivation. Fatigue may make it difficult to start or complete tasks because of exhaustion. Occasional or mild fatigue is often a normal response to activity or life. However, long-term (chronic) or extreme fatigue may be a symptom of a medical condition such as: Depression. Not having enough red blood cells or hemoglobin in the blood (anemia). A problem with a small gland located in the lower front part of the neck (thyroid  disorder). Rheumatologic conditions. These are problems related to the body's defense system (immune system). Infections, especially certain viral infections. Fatigue can also lead to negative health outcomes over time. Follow these instructions at home: Medicines Take over-the-counter and prescription medicines only as told by your health care  provider. Take a multivitamin if told by your health care provider. Do not use herbal or dietary supplements unless they are approved by your health care provider. Eating and drinking  Avoid heavy meals in the evening. Eat a well-balanced diet, which includes lean proteins, whole grains, plenty of fruits and vegetables, and low-fat dairy products. Avoid eating or drinking too many products with caffeine in them. Avoid alcohol. Drink enough fluid to keep your urine pale yellow. Activity  Exercise regularly, as told by your health care provider. Use or practice techniques to help you relax, such as yoga, tai chi, meditation, or massage therapy. Lifestyle Change situations that cause you stress. Try to keep your work and personal schedules in balance. Do not use recreational or illegal drugs. General instructions Monitor your fatigue for any changes. Go to bed and get up at the same time every day. Avoid fatigue by pacing yourself during the day and getting enough sleep at night. Maintain a healthy weight. Contact a health care provider if: Your fatigue does not get better. You have a fever. You suddenly lose or gain weight. You have headaches. You have trouble falling asleep or sleeping through the night. You feel angry, guilty, anxious, or sad. You have swelling in your legs or another part of your body. Get help right away if: You feel confused, feel like you might faint, or faint. Your vision is blurry or you have a severe headache. You have severe pain in your abdomen, your back, or the area between your waist and hips (pelvis). You have chest pain, shortness of breath, or an irregular or fast heartbeat. You are unable to urinate, or you urinate less than normal. You have abnormal bleeding from the rectum, nose, lungs, nipples, or, if you are male, the vagina. You vomit blood. You have thoughts about hurting yourself or others. These symptoms may be an emergency. Get help  right away. Call 911. Do not wait to see if the symptoms will go away. Do not drive yourself to the hospital. Get help right away if you feel like you may hurt yourself or others, or have thoughts about taking your own life. Go to your nearest emergency room or: Call 911. Call the National Suicide Prevention Lifeline at 548-256-9760 or 988. This is open 24 hours a day. Text the Crisis Text Line at (650) 467-5278. Summary If you have fatigue, you feel tired all the time and have a lack of energy or a lack of motivation. Fatigue may make it difficult to start or complete tasks because of exhaustion. Long-term (chronic) or extreme fatigue may  be a symptom of a medical condition. Exercise regularly, as told by your health care provider. Change situations that cause you stress. Try to keep your work and personal schedules in balance. This information is not intended to replace advice given to you by your health care provider. Make sure you discuss any questions you have with your health care provider. Document Revised: 05/03/2021 Document Reviewed: 05/03/2021 Elsevier Patient Education  2024 [employer].

## 2024-06-06 ENCOUNTER — Ambulatory Visit: Payer: Self-pay | Admitting: Neurology

## 2024-06-06 LAB — COMPREHENSIVE METABOLIC PANEL WITH GFR
ALT: 24 IU/L (ref 0–44)
AST: 26 IU/L (ref 0–40)
Albumin: 5 g/dL (ref 4.1–5.1)
Alkaline Phosphatase: 75 IU/L (ref 47–123)
BUN/Creatinine Ratio: 14 (ref 9–20)
BUN: 14 mg/dL (ref 6–24)
Bilirubin Total: 0.5 mg/dL (ref 0.0–1.2)
CO2: 21 mmol/L (ref 20–29)
Calcium: 9.9 mg/dL (ref 8.7–10.2)
Chloride: 103 mmol/L (ref 96–106)
Creatinine, Ser: 1 mg/dL (ref 0.76–1.27)
Globulin, Total: 2.7 g/dL (ref 1.5–4.5)
Glucose: 95 mg/dL (ref 70–99)
Potassium: 5 mmol/L (ref 3.5–5.2)
Sodium: 139 mmol/L (ref 134–144)
Total Protein: 7.7 g/dL (ref 6.0–8.5)
eGFR: 96 mL/min/1.73 (ref >59)

## 2024-06-06 LAB — CBC WITH DIFFERENTIAL/PLATELET
Basophils Absolute: 0.1 x10E3/uL (ref 0.0–0.2)
Basos: 1 %
EOS (ABSOLUTE): 0.2 x10E3/uL (ref 0.0–0.4)
Eos: 3 %
Hematocrit: 45.5 % (ref 37.5–51.0)
Hemoglobin: 15.2 g/dL (ref 13.0–17.7)
Immature Grans (Abs): 0.1 x10E3/uL (ref 0.0–0.1)
Immature Granulocytes: 1 %
Lymphocytes Absolute: 2.3 x10E3/uL (ref 0.7–3.1)
Lymphs: 28 %
MCH: 28.7 pg (ref 26.6–33.0)
MCHC: 33.4 g/dL (ref 31.5–35.7)
MCV: 86 fL (ref 79–97)
Monocytes Absolute: 0.4 x10E3/uL (ref 0.1–0.9)
Monocytes: 5 %
Neutrophils Absolute: 5.2 x10E3/uL (ref 1.4–7.0)
Neutrophils: 62 %
Platelets: 338 x10E3/uL (ref 150–450)
RBC: 5.29 x10E6/uL (ref 4.14–5.80)
RDW: 12.8 % (ref 11.6–15.4)
WBC: 8.2 x10E3/uL (ref 3.4–10.8)

## 2024-06-06 LAB — TSH: TSH: 0.75 u[IU]/mL (ref 0.450–4.500)

## 2024-06-06 LAB — VITAMIN B12: Vitamin B-12: 1079 pg/mL (ref 232–1245)

## 2024-06-06 LAB — HEMOGLOBIN A1C
Est. average glucose Bld gHb Est-mCnc: 120 mg/dL
Hgb A1c MFr Bld: 5.8 % — ABNORMAL HIGH (ref 4.8–5.6)

## 2024-06-06 LAB — VITAMIN D 25 HYDROXY (VIT D DEFICIENCY, FRACTURES): Vit D, 25-Hydroxy: 27.3 ng/mL — ABNORMAL LOW (ref 30.0–100.0)

## 2024-07-26 ENCOUNTER — Encounter: Payer: Self-pay | Admitting: Neurology

## 2024-08-13 NOTE — Telephone Encounter (Signed)
 Split [employer] state no auth req   Patient is scheduled at [facility] for 09/06/24 at 8 pm.  Mailed packet and sent mychart

## 2024-09-02 ENCOUNTER — Telehealth: Admitting: Neurology

## 2024-09-06 ENCOUNTER — Encounter
